# Patient Record
Sex: Male | Born: 1935 | Race: White | Hispanic: No | Marital: Married | Smoking: Former smoker
Health system: Southern US, Community
[De-identification: ages and names within clinical notes are randomized; demographics above are authoritative.]

## PROBLEM LIST (undated history)

## (undated) DIAGNOSIS — K219 Gastro-esophageal reflux disease without esophagitis: Secondary | ICD-10-CM

## (undated) DIAGNOSIS — G20A1 Parkinson's disease without dyskinesia, without mention of fluctuations: Secondary | ICD-10-CM

## (undated) DIAGNOSIS — I10 Essential (primary) hypertension: Secondary | ICD-10-CM

## (undated) DIAGNOSIS — W108XXA Fall (on) (from) other stairs and steps, initial encounter: Secondary | ICD-10-CM

## (undated) DIAGNOSIS — G20 Parkinson's disease: Secondary | ICD-10-CM

## (undated) DIAGNOSIS — N400 Enlarged prostate without lower urinary tract symptoms: Secondary | ICD-10-CM

## (undated) HISTORY — PX: HERNIA REPAIR: SHX51

---

## 1998-07-21 ENCOUNTER — Ambulatory Visit (HOSPITAL_COMMUNITY): Admission: RE | Admit: 1998-07-21 | Discharge: 1998-07-21 | Payer: Self-pay | Admitting: Ophthalmology

## 2000-12-05 ENCOUNTER — Ambulatory Visit (HOSPITAL_COMMUNITY): Admission: RE | Admit: 2000-12-05 | Discharge: 2000-12-05 | Payer: Self-pay | Admitting: Gastroenterology

## 2008-05-15 HISTORY — PX: CATARACT EXTRACTION: SUR2

## 2009-01-13 ENCOUNTER — Emergency Department (HOSPITAL_COMMUNITY): Admission: EM | Admit: 2009-01-13 | Discharge: 2009-01-13 | Payer: Self-pay | Admitting: Emergency Medicine

## 2009-06-23 ENCOUNTER — Ambulatory Visit (HOSPITAL_COMMUNITY)
Admission: RE | Admit: 2009-06-23 | Discharge: 2009-06-23 | Payer: Self-pay | Source: Home / Self Care | Admitting: General Surgery

## 2010-06-06 ENCOUNTER — Encounter
Admission: RE | Admit: 2010-06-06 | Discharge: 2010-06-06 | Payer: Self-pay | Source: Home / Self Care | Attending: Family Medicine | Admitting: Family Medicine

## 2010-08-03 LAB — PROTIME-INR: Prothrombin Time: 13.5 seconds (ref 11.6–15.2)

## 2010-08-03 LAB — BASIC METABOLIC PANEL
BUN: 17 mg/dL (ref 6–23)
CO2: 30 mEq/L (ref 19–32)
Calcium: 8.9 mg/dL (ref 8.4–10.5)
Creatinine, Ser: 0.94 mg/dL (ref 0.4–1.5)
GFR calc Af Amer: >60 mL/min (ref >60)
GFR calc non Af Amer: >60 mL/min (ref >60)
Glucose, Bld: 90 mg/dL (ref 70–99)
Potassium: 4.3 mEq/L (ref 3.5–5.1)

## 2010-08-03 LAB — DIFFERENTIAL
Basophils Relative: 1 % (ref 0–1)
Lymphocytes Relative: 27 % (ref 12–46)
Lymphs Abs: 1.4 10*3/uL (ref 0.7–4.0)
Monocytes Absolute: 0.6 10*3/uL (ref 0.1–1.0)
Monocytes Relative: 11 % (ref 3–12)
Neutro Abs: 3 10*3/uL (ref 1.7–7.7)
Neutrophils Relative %: 59 % (ref 43–77)

## 2010-08-03 LAB — CBC
HCT: 40.1 % (ref 39.0–52.0)
Hemoglobin: 13.9 g/dL (ref 13.0–17.0)
MCHC: 34.5 g/dL (ref 30.0–36.0)
MCV: 95.5 fL (ref 78.0–100.0)
WBC: 5.2 10*3/uL (ref 4.0–10.5)

## 2010-08-03 LAB — APTT: aPTT: 27 seconds (ref 24–37)

## 2010-09-30 NOTE — Procedures (Signed)
[facility]  Patient:    Tim Black, Tim Black                      MRN: [id number] Proc. Date: 12/05/00 Attending:  Verlin Grills, M.D. CC:         Al Decant. Janey Greaser, M.D., [facility], [facility], [address]                           Procedure Report  PROCEDURE:  Surveillance colonoscopy.  PROCEDURE INDICATION:  Mr. [name] (date of birth [birth year]) is a 77 year old male, who is due for surveillance colonoscopy with polypectomy to prevent colon cancer.  Mr. Petta has sigmoid colonic diverticulosis diagnosed by flexible proctosigmoidoscopy in the past.  I discussed with Mr. Hinojosa the complications associated with colonoscopy and polypectomy including a 15 per 1000 risk of bleeding and 4 per 1000 risk of colon rupture requiring emergency surgery.  Mr. Secrist has signed the operative permit.  ENDOSCOPIST:  Verlin Grills, M.D.  PREMEDICATION:  Versed 5 mg, Demerol 50 mg.  ENDOSCOPE:  Olympus pediatric colonoscope.  DESCRIPTION OF PROCEDURE:  After obtaining informed consent, Mr. Ramson was placed in the left lateral decubitus position.  I administered intravenous Demerol and intravenous Versed to achieve conscious sedation for the procedure.  The patients blood pressure, oxygen saturations, and cardiac rhythm were monitored throughout the procedure and documented in the medical record.  Anal inspection was normal.  Digital rectal examination was normal.  The Olympus pediatric video colonoscope was introduced into the rectum and easily advanced to the cecum.  Colonic preparation for the exam today was excellent.  RECTUM:  Normal.  SIGMOID COLON AND DESCENDING COLON:  Extensive left colonic diverticulosis.  SPLENIC FLEXURE:  Normal.  TRANSVERSE COLON:  Normal.  HEPATIC FLEXURE:  Normal.  ASCENDING COLON:  Normal.  CECUM AND ILEOCECAL VALVE:  Normal.  ASSESSMENT:  Left colonic  diverticulosis; otherwise normal proctocolonoscopy to the cecum.  No endoscopic evidence for the presence of colorectal neoplasia. DD:  12/05/00 TD:  12/05/00 Job: [id number]

## 2010-12-19 ENCOUNTER — Ambulatory Visit: Payer: Medicare Other | Attending: Family Medicine | Admitting: Physical Therapy

## 2010-12-19 DIAGNOSIS — R262 Difficulty in walking, not elsewhere classified: Secondary | ICD-10-CM | POA: Insufficient documentation

## 2010-12-19 DIAGNOSIS — IMO0001 Reserved for inherently not codable concepts without codable children: Secondary | ICD-10-CM | POA: Insufficient documentation

## 2010-12-19 DIAGNOSIS — R269 Unspecified abnormalities of gait and mobility: Secondary | ICD-10-CM | POA: Insufficient documentation

## 2010-12-19 DIAGNOSIS — M6281 Muscle weakness (generalized): Secondary | ICD-10-CM | POA: Insufficient documentation

## 2010-12-21 ENCOUNTER — Ambulatory Visit: Payer: Medicare Other | Admitting: Physical Therapy

## 2011-01-04 ENCOUNTER — Encounter: Payer: Medicare Other | Admitting: Physical Therapy

## 2011-01-09 ENCOUNTER — Encounter: Payer: Medicare Other | Admitting: Physical Therapy

## 2011-01-12 ENCOUNTER — Encounter: Payer: Medicare Other | Admitting: Physical Therapy

## 2011-04-06 ENCOUNTER — Emergency Department (HOSPITAL_COMMUNITY): Payer: Medicare Other

## 2011-04-06 ENCOUNTER — Encounter: Payer: Self-pay | Admitting: *Deleted

## 2011-04-06 ENCOUNTER — Emergency Department (HOSPITAL_COMMUNITY)
Admission: EM | Admit: 2011-04-06 | Discharge: 2011-04-06 | Disposition: A | Discharge status: Home / Self Care | Payer: Medicare Other | Attending: Emergency Medicine | Admitting: Emergency Medicine

## 2011-04-06 DIAGNOSIS — S0100XA Unspecified open wound of scalp, initial encounter: Secondary | ICD-10-CM | POA: Insufficient documentation

## 2011-04-06 DIAGNOSIS — S0101XA Laceration without foreign body of scalp, initial encounter: Secondary | ICD-10-CM

## 2011-04-06 DIAGNOSIS — I10 Essential (primary) hypertension: Secondary | ICD-10-CM | POA: Insufficient documentation

## 2011-04-06 DIAGNOSIS — K219 Gastro-esophageal reflux disease without esophagitis: Secondary | ICD-10-CM | POA: Insufficient documentation

## 2011-04-06 DIAGNOSIS — Y998 Other external cause status: Secondary | ICD-10-CM | POA: Insufficient documentation

## 2011-04-06 DIAGNOSIS — W108XXA Fall (on) (from) other stairs and steps, initial encounter: Secondary | ICD-10-CM | POA: Insufficient documentation

## 2011-04-06 HISTORY — DX: Gastro-esophageal reflux disease without esophagitis: K21.9

## 2011-04-06 HISTORY — DX: Essential (primary) hypertension: I10

## 2011-04-06 LAB — CBC
MCV: 93.5 fL (ref 78.0–100.0)
Platelets: 205 10*3/uL (ref 150–400)

## 2011-04-06 LAB — POCT I-STAT, CHEM 8
Glucose, Bld: 110 mg/dL — ABNORMAL HIGH (ref 70–99)
HCT: 42 % (ref 39.0–52.0)

## 2011-04-06 NOTE — ED Notes (Signed)
Patient not in room.will obtain labs upon patient return 

## 2011-04-06 NOTE — ED Notes (Signed)
Patient transported to X-ray 

## 2011-04-06 NOTE — ED Notes (Signed)
Family at bedside. 

## 2011-04-06 NOTE — ED Notes (Signed)
Pt placed in C-collar in triage

## 2011-04-06 NOTE — ED Provider Notes (Signed)
History     CSN: [id number] Arrival date & time: 04/06/2011  3:41 PM   First MD Initiated Contact with Patient 04/06/11 1601      Chief Complaint  Patient presents with  . Fall  . Head Injury    (Consider location/radiation/quality/duration/timing/severity/associated sxs/prior treatment) Patient is a 77 y.o. male presenting with fall and head injury. The history is provided by the patient and a relative. No language interpreter was used.  Fall The accident occurred 1 to 2 hours ago. The fall occurred while walking. Distance fallen: 3-5 stairs. He landed on concrete. The volume of blood lost was minimal. The point of impact was the head. The pain is at a severity of 8/10. The pain is severe. He was not ambulatory at the scene. There was no entrapment after the fall. There was no drug use involved in the accident. There was no alcohol use involved in the accident. Pertinent negatives include no visual change, no fever, no numbness, no abdominal pain, no bowel incontinence, no nausea, no vomiting, no hematuria, no headaches, no hearing loss and no tingling. Exacerbated by: nothing. Treatment on scene includes a c-collar. He has tried nothing for the symptoms. The treatment provided no relief.  Head Injury  Pertinent negatives include no numbness and no vomiting.    Past Medical History  Diagnosis Date  . Hypertension   . GERD (gastroesophageal reflux disease)     Past Surgical History  Procedure Date  . Hernia repair     No family history on file.  History  Substance Use Topics  . Smoking status: Never Smoker   . Smokeless tobacco: Not on file  . Alcohol Use: Yes      Review of Systems  Constitutional: Negative for fever.  HENT: Negative for facial swelling.   Eyes: Negative for discharge.  Respiratory: Negative for chest tightness.   Cardiovascular: Negative for chest pain.  Gastrointestinal: Negative for nausea, vomiting, abdominal pain, abdominal distention and  bowel incontinence.  Genitourinary: Negative for hematuria.  Musculoskeletal: Negative for arthralgias.  Skin: Negative.   Neurological: Negative for tingling, numbness and headaches.  Hematological: Negative.   Psychiatric/Behavioral: Negative.     Allergies  Review of patient's allergies indicates no known allergies.  Home Medications  No current outpatient prescriptions on file.  BP 142/61  Pulse 82  Resp 18  SpO2 98%  Physical Exam  Nursing note and vitals reviewed. Constitutional:       frail  HENT:  Right Ear: No hemotympanum.  Left Ear: No hemotympanum.       Scalp laceration  Eyes: EOM are normal. Pupils are equal, round, and reactive to light.  Neck: No tracheal deviation present.       c collar  Cardiovascular: Normal rate and regular rhythm.   Pulmonary/Chest: Effort normal and breath sounds normal. He exhibits no tenderness.  Abdominal: Soft. Bowel sounds are normal. He exhibits no mass. There is no rebound and no guarding.  Musculoskeletal: Normal range of motion. He exhibits no tenderness.  Neurological: He is alert. He displays normal reflexes.  Skin: Skin is warm and dry.  Psychiatric: Thought content normal.    ED Course  LACERATION REPAIR Date/Time: 04/06/2011 5:48 PM Performed by: Jasmine Awe Authorized by: Jasmine Awe Consent: Verbal consent obtained. Consent given by: patient Patient understanding: patient states understanding of the procedure being performed Patient identity confirmed: verbally with patient and arm band Body area: head/neck Location details: scalp Laceration length: 2 cm Foreign bodies: no  foreign bodies Tendon involvement: none Nerve involvement: none Vascular damage: no Amount of cleaning: extensive Skin closure: staples Number of sutures: 2 Approximation: loose Approximation difficulty: simple Patient tolerance: Patient tolerated the procedure well with no immediate complications.    (including critical care time)  Labs Reviewed - No data to display No results found.   No diagnosis found.    MDM   Follow up in 7 days for staple removal return        Marl Seago K Wynonna Fitzhenry-Rasch, MD 04/06/11 2307

## 2011-04-06 NOTE — ED Notes (Signed)
Patient transported to CT 

## 2011-04-06 NOTE — ED Notes (Signed)
Pt fell down approx 4-5 brick steps PTA. Unknown LOC. Found on rt side with head at base of steps, noted abrasions to rt head and knee, pain in upper arm with abrasions.

## 2011-04-15 ENCOUNTER — Encounter (HOSPITAL_COMMUNITY): Payer: Self-pay | Admitting: Emergency Medicine

## 2011-04-15 ENCOUNTER — Emergency Department (HOSPITAL_COMMUNITY): Payer: Medicare Other

## 2011-04-15 ENCOUNTER — Inpatient Hospital Stay (HOSPITAL_COMMUNITY)
Admission: EM | Admit: 2011-04-15 | Discharge: 2011-04-20 | DRG: 193 | Disposition: A | Discharge status: Home / Self Care | Payer: Medicare Other | Attending: Internal Medicine | Admitting: Internal Medicine

## 2011-04-15 DIAGNOSIS — N183 Chronic kidney disease, stage 3 unspecified: Secondary | ICD-10-CM | POA: Diagnosis present

## 2011-04-15 DIAGNOSIS — E871 Hypo-osmolality and hyponatremia: Secondary | ICD-10-CM | POA: Diagnosis present

## 2011-04-15 DIAGNOSIS — IMO0002 Reserved for concepts with insufficient information to code with codable children: Secondary | ICD-10-CM | POA: Diagnosis present

## 2011-04-15 DIAGNOSIS — I129 Hypertensive chronic kidney disease with stage 1 through stage 4 chronic kidney disease, or unspecified chronic kidney disease: Secondary | ICD-10-CM | POA: Diagnosis present

## 2011-04-15 DIAGNOSIS — D649 Anemia, unspecified: Secondary | ICD-10-CM | POA: Diagnosis present

## 2011-04-15 DIAGNOSIS — E860 Dehydration: Secondary | ICD-10-CM | POA: Diagnosis present

## 2011-04-15 DIAGNOSIS — I480 Paroxysmal atrial fibrillation: Secondary | ICD-10-CM

## 2011-04-15 DIAGNOSIS — I509 Heart failure, unspecified: Secondary | ICD-10-CM | POA: Diagnosis present

## 2011-04-15 DIAGNOSIS — I5033 Acute on chronic diastolic (congestive) heart failure: Secondary | ICD-10-CM

## 2011-04-15 DIAGNOSIS — I4891 Unspecified atrial fibrillation: Secondary | ICD-10-CM | POA: Diagnosis present

## 2011-04-15 DIAGNOSIS — J189 Pneumonia, unspecified organism: Principal | ICD-10-CM | POA: Diagnosis present

## 2011-04-15 HISTORY — DX: Benign prostatic hyperplasia without lower urinary tract symptoms: N40.0

## 2011-04-15 HISTORY — DX: Fall (on) (from) other stairs and steps, initial encounter: W10.8XXA

## 2011-04-15 LAB — CBC
HCT: 35.3 % — ABNORMAL LOW (ref 39.0–52.0)
MCH: 32.4 pg (ref 26.0–34.0)
MCHC: 34.3 g/dL (ref 30.0–36.0)
MCV: 94.6 fL (ref 78.0–100.0)
Platelets: 222 10*3/uL (ref 150–400)
RDW: 12.8 % (ref 11.5–15.5)

## 2011-04-15 LAB — DIFFERENTIAL
Basophils Absolute: 0 10*3/uL (ref 0.0–0.1)
Basophils Relative: 0 % (ref 0–1)
Eosinophils Absolute: 0 10*3/uL (ref 0.0–0.7)
Eosinophils Relative: 0 % (ref 0–5)
Monocytes Absolute: 1 10*3/uL (ref 0.1–1.0)

## 2011-04-15 LAB — BASIC METABOLIC PANEL
CO2: 23 mEq/L (ref 19–32)
Calcium: 9 mg/dL (ref 8.4–10.5)
Creatinine, Ser: 1.25 mg/dL (ref 0.50–1.35)
GFR calc Af Amer: 59 mL/min — ABNORMAL LOW (ref >90)
GFR calc non Af Amer: 51 mL/min — ABNORMAL LOW (ref >90)

## 2011-04-15 LAB — PRO B NATRIURETIC PEPTIDE: Pro B Natriuretic peptide (BNP): 1594 pg/mL — ABNORMAL HIGH (ref 0–450)

## 2011-04-15 LAB — PROTIME-INR: Prothrombin Time: 13.8 seconds (ref 11.6–15.2)

## 2011-04-15 MED ORDER — DEXTROSE 5 % IV SOLN
1.0000 g | INTRAVENOUS | Status: DC
  Administered 2011-04-15: 1 g via INTRAVENOUS
  Filled 2011-04-15: qty 10

## 2011-04-15 MED ORDER — AZITHROMYCIN 250 MG PO TABS
500.0000 mg | ORAL_TABLET | Freq: Once | ORAL | Status: AC
Start: 2011-04-15 — End: 2011-04-15
  Administered 2011-04-15: 500 mg via ORAL
  Filled 2011-04-15: qty 2

## 2011-04-15 NOTE — ED Provider Notes (Signed)
History     CSN: [id number] Arrival date & time: 04/15/2011  6:25 PM   First MD Initiated Contact with Patient 04/15/11 1850      Chief Complaint  Patient presents with  . Atrial Fibrillation    went to [facility] today for cough and was found with new onset A fib  . Cough    6 days  . Shortness of Breath    (Consider location/radiation/quality/duration/timing/severity/associated sxs/prior treatment) Patient is a 77 y.o. male presenting with atrial fibrillation, cough, and shortness of breath. The history is provided by the patient.  Atrial Fibrillation The current episode started more than 2 days ago. The problem occurs constantly. The problem has not changed since onset.Pertinent negatives include no chest pain and no shortness of breath. The symptoms are aggravated by nothing. The symptoms are relieved by nothing. He has tried nothing for the symptoms.  Cough This is a new problem. The problem occurs hourly. The cough is non-productive. There has been no fever. Pertinent negatives include no chest pain and no shortness of breath.  Shortness of Breath  Associated symptoms include cough. Pertinent negatives include no chest pain and no shortness of breath.   Pt went to the walk in clinic at [facility] today to be checked for a cough. The walk in clinic doctor diagnosed him with atrial fibrillation and sent him to the ED for further evaluation. Past Medical History  Diagnosis Date  . Hypertension   . GERD (gastroesophageal reflux disease)   . Prostate cancer     Past Surgical History  Procedure Date  . Hernia repair     No family history on file.  History  Substance Use Topics  . Smoking status: Never Smoker   . Smokeless tobacco: Not on file  . Alcohol Use: Yes      Review of Systems  Respiratory: Positive for cough. Negative for shortness of breath.   Cardiovascular: Negative for chest pain.  All other systems reviewed and are negative.    Allergies  Review of  patient's allergies indicates no known allergies.  Home Medications   Current Outpatient Rx  Name Route Sig Dispense Refill  . ASPIRIN 81 MG PO TBEC Oral Take 81 mg by mouth 2 days. Swallow whole.     Marland Kitchen FINASTERIDE 5 MG PO TABS Oral Take 5 mg by mouth daily.      Marland Kitchen ONE-DAILY MULTI VITAMINS PO TABS Oral Take 1 tablet by mouth daily.      Marland Kitchen OMEPRAZOLE 20 MG PO CPDR Oral Take 20 mg by mouth daily.        BP 136/59  Pulse 79  Temp(Src) 98.8 F (37.1 C) (Oral)  Resp 18  SpO2 99%  Physical Exam  Nursing note and vitals reviewed. Constitutional: He appears well-developed and well-nourished. No distress.  HENT:  Head: Normocephalic and atraumatic.  Right Ear: External ear normal.  Left Ear: External ear normal.  Eyes: Conjunctivae are normal. Right eye exhibits no discharge. Left eye exhibits no discharge. No scleral icterus.  Neck: Neck supple. No tracheal deviation present.  Cardiovascular: Normal rate and intact distal pulses.  An irregular rhythm present.  Pulmonary/Chest: Effort normal and breath sounds normal. No stridor. No respiratory distress. He has no wheezes. He has no rales.  Abdominal: Soft. Bowel sounds are normal. He exhibits no distension. There is no tenderness. There is no rebound and no guarding.  Musculoskeletal: He exhibits no edema and no tenderness.  Neurological: He is alert. He has normal  strength. No sensory deficit. Cranial nerve deficit:  no gross defecits noted. He exhibits normal muscle tone. He displays no seizure activity. Coordination normal.  Skin: Skin is warm and dry. No rash noted.  Psychiatric: He has a normal mood and affect.    ED Course  Procedures (including critical care time)  Date: 04/15/2011  Rate: 89  Rhythm: normal sinus rhythm and premature ventricular contractions (PVC)  QRS Axis: normal  Intervals: normal  ST/T Wave abnormalities: normal  Conduction Disutrbances:none  Narrative Interpretation: A fib not noted on current EKG   Old EKG Reviewed: No significant change   Medications  lisinopril-hydrochlorothiazide (PRINZIDE,ZESTORETIC) 10-12.5 MG per tablet (not administered)  cefTRIAXone (ROCEPHIN) 1 g in dextrose 5 % 50 mL IVPB (not administered)  azithromycin (ZITHROMAX) tablet 500 mg (not administered)     Labs Reviewed  CBC - Abnormal; Notable for the following:    WBC 10.7 (*)    RBC 3.73 (*)    Hemoglobin 12.1 (*)    HCT 35.3 (*)    All other components within normal limits  DIFFERENTIAL - Abnormal; Notable for the following:    Neutrophils Relative 83 (*)    Neutro Abs 8.9 (*)    Lymphocytes Relative 7 (*)    All other components within normal limits  BASIC METABOLIC PANEL - Abnormal; Notable for the following:    Sodium 131 (*)    Glucose, Bld 102 (*)    BUN 37 (*)    GFR calc non Af Amer 51 (*)    GFR calc Af Amer 59 (*)    All other components within normal limits  PRO B NATRIURETIC PEPTIDE - Abnormal; Notable for the following:    BNP, POC 1594.0 (*)    All other components within normal limits  PROTIME-INR  TROPONIN I  APTT   Dg Chest 2 View  04/15/2011  *RADIOLOGY REPORT*  Clinical Data: Cough with shortness of breath  CHEST - 2 VIEW  Comparison: 04/06/2011.  Findings: Cardiomegaly.  Marked worsening aeration compared with priors with new areas of airspace opacity in the left mid and lower lung zones.  Findings consistent with development of acute bacterial pneumonia likely involving the left lower lobe.  Moderate hyperinflation suggesting COPD.  Calcified tortuous aorta. Osteopenia.  IMPRESSION:  Left mid and lower lung zone infiltrates as described. Cardiomegaly.  These are new from 04/06/2011.  The the  Original Report Authenticated By: Elsie Stain, M.D.     1. Community acquired pneumonia   2. Paroxysmal atrial fibrillation       MDM  Patient presents to the emergency room from an outside clinic with complaints of cough and atrial fibrillation. The EKGs here in the  emergency room show a normal sinus rhythm with sinus arrhythmia. I did review the EKGs at the other facility and the patient was clearly  more tachycardic and I do agree that it looked like atrial fibrillation.  The patient however does appear to have a pneumonia on the chest x-ray. He has not been in the hospital recently and I will treat him for a community acquired pneumonia. I will consult the hospitalist for admission considering the patient's age and comorbidities.        Celene Kras, MD 04/15/11 2157

## 2011-04-15 NOTE — H&P (Signed)
PCP:   [name], MD   Chief Complaint:  Cough 4-6 days  HPI: Tim Black is a 77 y.o. male   has a past medical history of Hypertension; GERD (gastroesophageal reflux disease); and Prostate cancer.   Presented with  Initially to [facility] urgent care with cough. Had CXR done that showed pneumonia. ECG showed A. Fib that was new. Since arrival to ED this seem to be Paroxysmal. With patient going in and out of this.  OF not last week he has fallen and hit his head resulting in scalp lacerations.   No recent headachaches or unstable gait no changes in mental status.  Review of Systems:    Pertinent Positives: fatigue, cough, no appetitie  Pertinent Negatives:no CP, no SOB, no nausea/vomiting Otherwise ROS are negative except for above, 10 systems were reviewed  Past Medical History: Past Medical History  Diagnosis Date  . Hypertension   . GERD (gastroesophageal reflux disease)   . Prostate cancer    Past Surgical History  Procedure Date  . Hernia repair      Medications: Prior to Admission medications   Medication Sig Start Date End Date Taking? Authorizing Provider  finasteride (PROSCAR) 5 MG tablet Take 5 mg by mouth daily.     Yes Historical Provider, MD  lisinopril-hydrochlorothiazide (PRINZIDE,ZESTORETIC) 10-12.5 MG per tablet Take 1 tablet by mouth daily.     Yes Historical Provider, MD  Multiple Vitamin (MULTIVITAMIN) tablet Take 1 tablet by mouth daily.     Yes Historical Provider, MD  omeprazole (PRILOSEC) 20 MG capsule Take 20 mg by mouth daily.     Yes Historical Provider, MD  aspirin 81 MG EC tablet Take 81 mg by mouth every other day. Swallow whole.    Historical Provider, MD    Allergies:  No Known Allergies  Social History:  reports that he has never smoked. He does not have any smokeless tobacco history on file. He reports that he drinks alcohol. He reports that he does not use illicit drugs.   Family History: family history is not on file.     Physical Exam: Patient Vitals for the past 24 hrs:  BP Temp Temp src Pulse Resp SpO2  04/15/11 2330 125/54 mmHg - - - 27  -  04/15/11 2300 114/56 mmHg - - 91  31  94 %  04/15/11 2257 116/58 mmHg 98.8 F (37.1 C) Oral 92  16  94 %  04/15/11 2230 92/45 mmHg - - - 23  -  04/15/11 2200 116/69 mmHg - - - 19  -  04/15/11 2145 131/62 mmHg - - 87  28  95 %  04/15/11 2130 110/60 mmHg - - 86  29  94 %  04/15/11 2115 119/65 mmHg - - 94  23  94 %  04/15/11 2000 111/58 mmHg - - 109  30  92 %  04/15/11 1945 118/68 mmHg - - 99  22  96 %  04/15/11 1930 124/66 mmHg - - 105  30  95 %  04/15/11 1915 120/65 mmHg - - 111  28  97 %  04/15/11 1900 110/61 mmHg - - 92  25  97 %  04/15/11 1829 136/59 mmHg 98.8 F (37.1 C) Oral 79  18  99 %  04/15/11 1811 - - - - - 97 %     Alert and Oriented in No Acute distress Mucous Membranes and Skin: Dry mucous membranes decreased skin to Head well healing stitches on her right side of the  head Heart: Somewhat irregular no murmurs appreciated Lungs: Crackles throughout no wheezes Abdomen: Soft nontender nondistended Lower extremities: No clubbing cyanosis or edema Neurologically Grossly intact: Strength intact Skin clean Dry  no rash: Abrasions to the knees noted body mass index is unknown because there is no height or weight on file.   Labs on Admission:   [facility] 04/15/11 1943  NA 131*  K 3.9  CL 98  CO2 23  GLUCOSE 102*  BUN 37*  CREATININE 1.25  CALCIUM 9.0  MG --  PHOS --   No results found for this basename: AST:2,ALT:2,ALKPHOS:2,BILITOT:2,PROT:2,ALBUMIN:2 in the last 72 hours No results found for this basename: LIPASE:2,AMYLASE:2 in the last 72 hours  Basename 04/15/11 1943  WBC 10.7*  NEUTROABS 8.9*  HGB 12.1*  HCT 35.3*  MCV 94.6  PLT 222    Basename 04/15/11 1943  CKTOTAL --  CKMB --  CKMBINDEX --  TROPONINI <0.30   No results found for this basename: TSH,T4TOTAL,FREET3,T3FREE,THYROIDAB in the last 72 hours No results  found for this basename: VITAMINB12:2,FOLATE:2,FERRITIN:2,TIBC:2,IRON:2,RETICCTPCT:2 in the last 72 hours No results found for this basename: HGBA1C    CrCl is unknown because there is no height on file for the current visit. ABG    Component Value Date/Time   TCO2 29 04/06/2011 1734     No results found for this basename: DDIMER     Other results: ED ECG REPORT  Rate: 89  Rhythm: Sinus arrhythmia with PVCs ST&T Change: No ischemic changes noted    Blood Culture No results found for this basename: sdes, specrequest, cult, reptstatus       Radiological Exams on Admission: Dg Chest 2 View  04/15/2011  *RADIOLOGY REPORT*  Clinical Data: Cough with shortness of breath  CHEST - 2 VIEW  Comparison: 04/06/2011.  Findings: Cardiomegaly.  Marked worsening aeration compared with priors with new areas of airspace opacity in the left mid and lower lung zones.  Findings consistent with development of acute bacterial pneumonia likely involving the left lower lobe.  Moderate hyperinflation suggesting COPD.  Calcified tortuous aorta. Osteopenia.  IMPRESSION:  Left mid and lower lung zone infiltrates as described. Cardiomegaly.  These are new from 04/06/2011.  The the  Original Report Authenticated By: Elsie Stain, M.D.    Assessment/Plan  Present on Admission:  .Pneumonia - this is community-acquired we'll cover with Rocephin and azithromycin, will admit for treatment of CAP will start on appropriate antibiotic coverage.   Obtain sputum cultures, blood cultures if febrile or if decompensates.  Provide oxygen as needed.   .Atrial fib/flutter, transient - this is new in onset appears to be transient patient has a runs of A. fib as watching him on a monitor then goes back to sinus rhythm. Given the fact that he has had a recent head trauma will obtain a repeat CT scan of his head prior to initiating anticoagulation. Whether he needs to be a long-term Coumadin should be considered given his recent  falls and advanced age. We'll cycle cardiac markers check TSH and obtain echocardiogram in the morning. Duration of atrial fibrillation at this point is unclear  .Hyponatremia - this is likely secondary to dehydration will check urine lites follow sodium give IV fluids  .Anemia - this is mild can be worked up as an outpatient  .Dehydration - patient appears to be clinically to be on the dry side we'll give IV fluids check orthostatics   Prophylaxis: Protonix and Lovenox CT scan of the head is unremarkable  CODE STATUS: Full code   Milania Haubner 04/15/2011, 11:59 PM

## 2011-04-16 ENCOUNTER — Emergency Department (HOSPITAL_COMMUNITY): Payer: Medicare Other

## 2011-04-16 ENCOUNTER — Encounter (HOSPITAL_COMMUNITY): Payer: Self-pay | Admitting: Internal Medicine

## 2011-04-16 DIAGNOSIS — D649 Anemia, unspecified: Secondary | ICD-10-CM | POA: Diagnosis present

## 2011-04-16 DIAGNOSIS — E871 Hypo-osmolality and hyponatremia: Secondary | ICD-10-CM | POA: Diagnosis present

## 2011-04-16 DIAGNOSIS — E860 Dehydration: Secondary | ICD-10-CM | POA: Diagnosis present

## 2011-04-16 LAB — URINALYSIS, ROUTINE W REFLEX MICROSCOPIC
Leukocytes, UA: NEGATIVE
Protein, ur: NEGATIVE mg/dL
Urobilinogen, UA: 1 mg/dL (ref 0.0–1.0)

## 2011-04-16 LAB — CARDIAC PANEL(CRET KIN+CKTOT+MB+TROPI)
Relative Index: 2.2 (ref 0.0–2.5)
Relative Index: 2.3 (ref 0.0–2.5)
Total CK: 142 U/L (ref 7–232)
Total CK: 140 U/L (ref 7–232)
Total CK: 128 U/L (ref 7–232)
Troponin I: <0.3 ng/mL (ref <0.30)

## 2011-04-16 LAB — DIFFERENTIAL
Basophils Absolute: 0 10*3/uL (ref 0.0–0.1)
Basophils Relative: 0 % (ref 0–1)
Eosinophils Absolute: 0 10*3/uL (ref 0.0–0.7)
Eosinophils Relative: 0 % (ref 0–5)
Monocytes Absolute: 1.1 10*3/uL — ABNORMAL HIGH (ref 0.1–1.0)

## 2011-04-16 LAB — CBC
MCH: 32.2 pg (ref 26.0–34.0)
MCHC: 33.9 g/dL (ref 30.0–36.0)
MCV: 95.1 fL (ref 78.0–100.0)
Platelets: 225 10*3/uL (ref 150–400)
RDW: 13 % (ref 11.5–15.5)
WBC: 9.3 10*3/uL (ref 4.0–10.5)

## 2011-04-16 LAB — COMPREHENSIVE METABOLIC PANEL
ALT: 30 U/L (ref 0–53)
Albumin: 2.6 g/dL — ABNORMAL LOW (ref 3.5–5.2)
Alkaline Phosphatase: 97 U/L (ref 39–117)
BUN: 32 mg/dL — ABNORMAL HIGH (ref 6–23)
Chloride: 100 mEq/L (ref 96–112)
Potassium: 3.7 mEq/L (ref 3.5–5.1)
Sodium: 137 mEq/L (ref 135–145)
Total Bilirubin: 0.4 mg/dL (ref 0.3–1.2)

## 2011-04-16 LAB — PROTIME-INR: Prothrombin Time: 14.2 seconds (ref 11.6–15.2)

## 2011-04-16 LAB — HEMOGLOBIN A1C
Hgb A1c MFr Bld: 6 % — ABNORMAL HIGH (ref <5.7)
Mean Plasma Glucose: 126 mg/dL — ABNORMAL HIGH (ref <117)

## 2011-04-16 LAB — LIPID PANEL
Cholesterol: 134 mg/dL (ref 0–200)
Triglycerides: 89 mg/dL (ref <150)

## 2011-04-16 LAB — PHOSPHORUS: Phosphorus: 3.1 mg/dL (ref 2.3–4.6)

## 2011-04-16 LAB — SODIUM, URINE, RANDOM: Sodium, Ur: 114 mEq/L

## 2011-04-16 LAB — STREP PNEUMONIAE URINARY ANTIGEN: Strep Pneumo Urinary Antigen: NEGATIVE

## 2011-04-16 MED ORDER — IPRATROPIUM BROMIDE 0.02 % IN SOLN
0.5000 mg | Freq: Four times a day (QID) | RESPIRATORY_TRACT | Status: DC
  Administered 2011-04-16 – 2011-04-17 (×7): 0.5 mg via RESPIRATORY_TRACT
  Filled 2011-04-16 (×7): qty 2.5

## 2011-04-16 MED ORDER — FINASTERIDE 5 MG PO TABS
5.0000 mg | ORAL_TABLET | Freq: Every day | ORAL | Status: DC
  Administered 2011-04-16 – 2011-04-20 (×5): 5 mg via ORAL
  Filled 2011-04-16 (×5): qty 1

## 2011-04-16 MED ORDER — PANTOPRAZOLE SODIUM 40 MG PO TBEC
40.0000 mg | DELAYED_RELEASE_TABLET | Freq: Every day | ORAL | Status: DC
  Administered 2011-04-16 – 2011-04-19 (×4): 40 mg via ORAL
  Filled 2011-04-16 (×6): qty 1

## 2011-04-16 MED ORDER — ALBUTEROL SULFATE (5 MG/ML) 0.5% IN NEBU
2.5000 mg | INHALATION_SOLUTION | Freq: Four times a day (QID) | RESPIRATORY_TRACT | Status: DC
  Administered 2011-04-16 – 2011-04-17 (×7): 2.5 mg via RESPIRATORY_TRACT
  Filled 2011-04-16: qty 0.5
  Filled 2011-04-16: qty 1
  Filled 2011-04-16 (×5): qty 0.5

## 2011-04-16 MED ORDER — DEXTROSE 5 % IV SOLN
500.0000 mg | INTRAVENOUS | Status: DC
  Administered 2011-04-16 – 2011-04-18 (×3): 500 mg via INTRAVENOUS
  Filled 2011-04-16 (×4): qty 500

## 2011-04-16 MED ORDER — ENOXAPARIN SODIUM 40 MG/0.4ML ~~LOC~~ SOLN
40.0000 mg | SUBCUTANEOUS | Status: DC
  Administered 2011-04-16 – 2011-04-20 (×5): 40 mg via SUBCUTANEOUS
  Filled 2011-04-16 (×5): qty 0.4

## 2011-04-16 MED ORDER — SODIUM CHLORIDE 0.9 % IV SOLN
INTRAVENOUS | Status: AC
  Administered 2011-04-16: 06:00:00 via INTRAVENOUS

## 2011-04-16 MED ORDER — METOPROLOL TARTRATE 12.5 MG HALF TABLET
12.5000 mg | ORAL_TABLET | Freq: Four times a day (QID) | ORAL | Status: DC
Start: 2011-04-16 — End: 2011-04-20
  Administered 2011-04-16 – 2011-04-20 (×17): 12.5 mg via ORAL
  Filled 2011-04-16 (×5): qty 1
  Filled 2011-04-16: qty 0.5
  Filled 2011-04-16 (×14): qty 1
  Filled 2011-04-16: qty 0.5
  Filled 2011-04-16 (×2): qty 1

## 2011-04-16 MED ORDER — DEXTROSE 5 % IV SOLN
1.0000 g | INTRAVENOUS | Status: DC
  Administered 2011-04-16 – 2011-04-18 (×3): 1 g via INTRAVENOUS
  Filled 2011-04-16 (×4): qty 10

## 2011-04-16 NOTE — Progress Notes (Signed)
Patient ID: Tim Black, male   DOB: 11-23-1923, 77 y.o.   MRN: [id number] Subjective: Patient seen.Complain of cough-not productive.He denies any chest pain or sob.No fever   Objective: Weight change:   Intake/Output Summary (Last 24 hours) at 04/16/11 0829 Last data filed at 04/16/11 0616  Gross per 24 hour  Intake      0 ml  Output    725 ml  Net   -725 ml   BP 118/61  Pulse 66  Temp(Src) 98.3 F (36.8 C) (Oral)  Resp 20  SpO2 97% Physical Exam: General appearance: alert, cooperative and no distress,dehydrated Head: Normocephalic, without obvious abnormality, atraumatic Neck: no adenopathy, no carotid bruit, no JVD, supple, symmetrical, trachea midline and thyroid not enlarged, symmetric, no tenderness/mass/nodules Lungs: Crackles lower zones of the lungs bilaterally(left>Right) Heart: regular rate and rhythm, S1, S2 normal, no murmur, click, rub or gallop Abdomen: soft, non-tender; bowel sounds normal; no masses,  no organomegaly Extremities: extremities normal, atraumatic, no cyanosis or edema Skin: Dry  Lab Results: Results for orders placed during the hospital encounter of 04/15/11 (from the past 48 hour(s))  PRO B NATRIURETIC PEPTIDE     Status: Abnormal   Collection Time   04/15/11  7:29 PM      Component Value Range Comment   BNP, POC 1594.0 (*) 0 - 450 (pg/mL)   CBC     Status: Abnormal   Collection Time   04/15/11  7:43 PM      Component Value Range Comment   WBC 10.7 (*) 4.0 - 10.5 (K/uL)    RBC 3.73 (*) 4.22 - 5.81 (MIL/uL)    Hemoglobin 12.1 (*) 13.0 - 17.0 (g/dL)    HCT 86.5 (*) 78.4 - 52.0 (%)    MCV 94.6  78.0 - 100.0 (fL)    MCH 32.4  26.0 - 34.0 (pg)    MCHC 34.3  30.0 - 36.0 (g/dL)    RDW 69.6  29.5 - 28.4 (%)    Platelets 222  150 - 400 (K/uL)   DIFFERENTIAL     Status: Abnormal   Collection Time   04/15/11  7:43 PM      Component Value Range Comment   Neutrophils Relative 83 (*) 43 - 77 (%)    Neutro Abs 8.9 (*) 1.7 - 7.7 (K/uL)    Lymphocytes Relative 7 (*) 12 - 46 (%)    Lymphs Abs 0.8  0.7 - 4.0 (K/uL)    Monocytes Relative 9  3 - 12 (%)    Monocytes Absolute 1.0  0.1 - 1.0 (K/uL)    Eosinophils Relative 0  0 - 5 (%)    Eosinophils Absolute 0.0  0.0 - 0.7 (K/uL)    Basophils Relative 0  0 - 1 (%)    Basophils Absolute 0.0  0.0 - 0.1 (K/uL)   BASIC METABOLIC PANEL     Status: Abnormal   Collection Time   04/15/11  7:43 PM      Component Value Range Comment   Sodium 131 (*) 135 - 145 (mEq/L)    Potassium 3.9  3.5 - 5.1 (mEq/L)    Chloride 98  96 - 112 (mEq/L)    CO2 23  19 - 32 (mEq/L)    Glucose, Bld 102 (*) 70 - 99 (mg/dL)    BUN 37 (*) 6 - 23 (mg/dL)    Creatinine, Ser 1.32  0.50 - 1.35 (mg/dL)    Calcium 9.0  8.4 - 10.5 (mg/dL)    GFR  calc non Af Amer 51 (*) >90 (mL/min)    GFR calc Af Amer 59 (*) >90 (mL/min)   PROTIME-INR     Status: Normal   Collection Time   04/15/11  7:43 PM      Component Value Range Comment   Prothrombin Time 13.8  11.6 - 15.2 (seconds)    INR 1.04  0.00 - 1.49    TROPONIN I     Status: Normal   Collection Time   04/15/11  7:43 PM      Component Value Range Comment   Troponin I <0.30  <0.30 (ng/mL)   APTT     Status: Normal   Collection Time   04/15/11  7:43 PM      Component Value Range Comment   aPTT 37  24 - 37 (seconds)   URINALYSIS, ROUTINE W REFLEX MICROSCOPIC     Status: Abnormal   Collection Time   04/16/11  2:19 AM      Component Value Range Comment   Color, Urine YELLOW  YELLOW     APPearance CLEAR  CLEAR     Specific Gravity, Urine 1.019  1.005 - 1.030     pH 5.5  5.0 - 8.0     Glucose, UA NEGATIVE  NEGATIVE (mg/dL)    Hgb urine dipstick LARGE (*) NEGATIVE     Bilirubin Urine NEGATIVE  NEGATIVE     Ketones, ur NEGATIVE  NEGATIVE (mg/dL)    Protein, ur NEGATIVE  NEGATIVE (mg/dL)    Urobilinogen, UA 1.0  0.0 - 1.0 (mg/dL)    Nitrite NEGATIVE  NEGATIVE     Leukocytes, UA NEGATIVE  NEGATIVE    URINE MICROSCOPIC-ADD ON     Status: Normal   Collection Time    04/16/11  2:19 AM      Component Value Range Comment   WBC, UA 0-2  <3 (WBC/hpf)    RBC / HPF 7-10  <3 (RBC/hpf)   CARDIAC PANEL(CRET KIN+CKTOT+MB+TROPI)     Status: Normal   Collection Time   04/16/11  2:47 AM      Component Value Range Comment   Total CK 142  7 - 232 (U/L)    CK, MB 3.4  0.3 - 4.0 (ng/mL)    Troponin I <0.30  <0.30 (ng/mL)    Relative Index 2.4  0.0 - 2.5    MAGNESIUM     Status: Normal   Collection Time   04/16/11  4:11 AM      Component Value Range Comment   Magnesium 2.1  1.5 - 2.5 (mg/dL)   PHOSPHORUS     Status: Normal   Collection Time   04/16/11  4:11 AM      Component Value Range Comment   Phosphorus 3.1  2.3 - 4.6 (mg/dL)   COMPREHENSIVE METABOLIC PANEL     Status: Abnormal   Collection Time   04/16/11  4:11 AM      Component Value Range Comment   Sodium 137  135 - 145 (mEq/L)    Potassium 3.7  3.5 - 5.1 (mEq/L)    Chloride 100  96 - 112 (mEq/L)    CO2 27  19 - 32 (mEq/L)    Glucose, Bld 94  70 - 99 (mg/dL)    BUN 32 (*) 6 - 23 (mg/dL)    Creatinine, Ser 4.54  0.50 - 1.35 (mg/dL)    Calcium 8.9  8.4 - 10.5 (mg/dL)    Total Protein 6.7  6.0 - 8.3 (  g/dL)    Albumin 2.6 (*) 3.5 - 5.2 (g/dL)    AST 36  0 - 37 (U/L)    ALT 30  0 - 53 (U/L)    Alkaline Phosphatase 97  39 - 117 (U/L)    Total Bilirubin 0.4  0.3 - 1.2 (mg/dL)    GFR calc non Af Amer 55 (*) >90 (mL/min)    GFR calc Af Amer 63 (*) >90 (mL/min)   CBC     Status: Abnormal   Collection Time   04/16/11  4:11 AM      Component Value Range Comment   WBC 9.3  4.0 - 10.5 (K/uL)    RBC 3.66 (*) 4.22 - 5.81 (MIL/uL)    Hemoglobin 11.8 (*) 13.0 - 17.0 (g/dL)    HCT 16.1 (*) 09.6 - 52.0 (%)    MCV 95.1  78.0 - 100.0 (fL)    MCH 32.2  26.0 - 34.0 (pg)    MCHC 33.9  30.0 - 36.0 (g/dL)    RDW 04.5  40.9 - 81.1 (%)    Platelets 225  150 - 400 (K/uL)   DIFFERENTIAL     Status: Abnormal   Collection Time   04/16/11  4:11 AM      Component Value Range Comment   Neutrophils Relative 80 (*) 43 - 77  (%)    Neutro Abs 7.4  1.7 - 7.7 (K/uL)    Lymphocytes Relative 8 (*) 12 - 46 (%)    Lymphs Abs 0.7  0.7 - 4.0 (K/uL)    Monocytes Relative 12  3 - 12 (%)    Monocytes Absolute 1.1 (*) 0.1 - 1.0 (K/uL)    Eosinophils Relative 0  0 - 5 (%)    Eosinophils Absolute 0.0  0.0 - 0.7 (K/uL)    Basophils Relative 0  0 - 1 (%)    Basophils Absolute 0.0  0.0 - 0.1 (K/uL)   PROTIME-INR     Status: Normal   Collection Time   04/16/11  4:11 AM      Component Value Range Comment   Prothrombin Time 14.2  11.6 - 15.2 (seconds)    INR 1.08  0.00 - 1.49    APTT     Status: Abnormal   Collection Time   04/16/11  4:11 AM      Component Value Range Comment   aPTT 38 (*) 24 - 37 (seconds)   PRO B NATRIURETIC PEPTIDE     Status: Abnormal   Collection Time   04/16/11  4:11 AM      Component Value Range Comment   BNP, POC 1906.0 (*) 0 - 450 (pg/mL)     Micro Results: No results found for this or any previous visit (from the past 240 hour(s)).  Studies/Results: Dg Chest 2 View  04/15/2011  *RADIOLOGY REPORT*  Clinical Data: Cough with shortness of breath  CHEST - 2 VIEW  Comparison: 04/06/2011.  Findings: Cardiomegaly.  Marked worsening aeration compared with priors with new areas of airspace opacity in the left mid and lower lung zones.  Findings consistent with development of acute bacterial pneumonia likely involving the left lower lobe.  Moderate hyperinflation suggesting COPD.  Calcified tortuous aorta. Osteopenia.  IMPRESSION:  Left mid and lower lung zone infiltrates as described. Cardiomegaly.  These are new from 04/06/2011.  The the  Original Report Authenticated By: Elsie Stain, M.D.   Dg Chest 2 View  04/06/2011  *RADIOLOGY REPORT*  Clinical Data: Fall.  CHEST - 2 VIEW  Comparison: PA and lateral chest 06/21/2009.  Findings: Lungs are clear.  Heart size is normal.  No pneumothorax or pleural effusion.  No focal bony abnormality.  IMPRESSION: No acute disease.  Original Report Authenticated By:  Bernadene Bell. Maricela Curet, M.D.   Dg Pelvis 1-2 Views  04/06/2011  *RADIOLOGY REPORT*  Clinical Data: Fall, pain.  PELVIS - 1-2 VIEW  Comparison: None.  Findings: Hips are located.  No fracture.  There is some degenerative change about the hips.  Soft tissues unremarkable.  IMPRESSION: No acute finding.  Original Report Authenticated By: Bernadene Bell. Maricela Curet, M.D.   Ct Head Wo Contrast  04/16/2011  *RADIOLOGY REPORT*  Clinical Data: Evaluate for hemorrhage.  CT HEAD WITHOUT CONTRAST  Technique:  Contiguous axial images were obtained from the base of the skull through the vertex without contrast.  Comparison: Head CT 04/06/2011  Findings: Stable appearance of the brain.  Chronic microvascular ischemic changes in the periventricular white matter.  Negative for hemorrhage, hydrocephalus, mass effect, mass lesion, or evidence of acute infarction.  The visualized paranasal sinuses, mastoid air cells, and middle ears are clear.  The skull is intact  IMPRESSION: No acute intracranial abnormality.  Chronic microvascular ischemic changes.  Original Report Authenticated By: Britta Mccreedy, M.D.   Ct Head Wo Contrast  04/06/2011  *RADIOLOGY REPORT*  Clinical Data:  Fall, head injury  CT HEAD WITHOUT CONTRAST CT CERVICAL SPINE WITHOUT CONTRAST  Technique:  Multidetector CT imaging of the head and cervical spine was performed following the standard protocol without intravenous contrast.  Multiplanar CT image reconstructions of the cervical spine were also generated.  Comparison:  01/13/2009  CT HEAD  Findings: Motion artifacts, which repeat imaging was performed. Mild atrophy. Normal ventricular morphology. No midline shift or mass effect. Small vessel chronic ischemic changes of deep cerebral white matter. No intracranial hemorrhage, mass lesion or evidence of acute infarction. No definite extra-axial fluid collections. Bones appear demineralized. Visualized paranasal sinuses and mastoid air cells clear. No acute calvarial  abnormality.  IMPRESSION: Atrophy with small vessel chronic ischemic changes of deep cerebral white matter. No acute intracranial abnormalities.  CT CERVICAL SPINE  Findings: Osseous demineralization. Visualized skull base intact. Lung apices clear. Prevertebral soft tissues normal thickness. Disc space narrowing endplate spur formation C6-C7. Minimal scattered facet degenerative changes. Vertebral body heights maintained without fracture or subluxation. No bone destruction.  IMPRESSION: Minimal degenerative disc and facet disease changes of the cervical spine. Osseous demineralization. No acute bony abnormalities.  Original Report Authenticated By: Lollie Marrow, M.D.   Ct Cervical Spine Wo Contrast  04/06/2011  *RADIOLOGY REPORT*  Clinical Data:  Fall, head injury  CT HEAD WITHOUT CONTRAST CT CERVICAL SPINE WITHOUT CONTRAST  Technique:  Multidetector CT imaging of the head and cervical spine was performed following the standard protocol without intravenous contrast.  Multiplanar CT image reconstructions of the cervical spine were also generated.  Comparison:  01/13/2009  CT HEAD  Findings: Motion artifacts, which repeat imaging was performed. Mild atrophy. Normal ventricular morphology. No midline shift or mass effect. Small vessel chronic ischemic changes of deep cerebral white matter. No intracranial hemorrhage, mass lesion or evidence of acute infarction. No definite extra-axial fluid collections. Bones appear demineralized. Visualized paranasal sinuses and mastoid air cells clear. No acute calvarial abnormality.  IMPRESSION: Atrophy with small vessel chronic ischemic changes of deep cerebral white matter. No acute intracranial abnormalities.  CT CERVICAL SPINE  Findings: Osseous demineralization. Visualized skull base intact. Lung apices clear.  Prevertebral soft tissues normal thickness. Disc space narrowing endplate spur formation C6-C7. Minimal scattered facet degenerative changes. Vertebral body heights  maintained without fracture or subluxation. No bone destruction.  IMPRESSION: Minimal degenerative disc and facet disease changes of the cervical spine. Osseous demineralization. No acute bony abnormalities.  Original Report Authenticated By: Lollie Marrow, M.D.   Dg Knee Complete 4 Views Right  04/06/2011  *RADIOLOGY REPORT*  Clinical Data: Fall, pain.  Laceration.  RIGHT KNEE - COMPLETE 4+ VIEW  Comparison: None.  Findings: No fracture, dislocation or joint effusion is identified. No radiopaque foreign body.  IMPRESSION: No acute finding.  Original Report Authenticated By: Bernadene Bell. D'ALESSIO, M.D.   Dg Humerus Right  04/06/2011  *RADIOLOGY REPORT*  Clinical Data: Fall, pain.  RIGHT HUMERUS - 2+ VIEW  Comparison: None.  Findings: No acute bony or joint abnormality is identified.  No marked degenerative change.  IMPRESSION: Negative exam.  Original Report Authenticated By: Bernadene Bell. Maricela Curet, M.D.   Medications: Scheduled Meds:   . albuterol  2.5 mg Nebulization Q6H  . azithromycin  500 mg Intravenous Q24H  . azithromycin  500 mg Oral Once  . cefTRIAXone (ROCEPHIN)  IV  1 g Intravenous Q24H  . enoxaparin  40 mg Subcutaneous Q24H  . finasteride  5 mg Oral Daily  . ipratropium  0.5 mg Nebulization Q6H  . metoprolol tartrate  12.5 mg Oral Q6H  . pantoprazole  40 mg Oral Q1200  . DISCONTD: cefTRIAXone (ROCEPHIN)  IV  1 g Intravenous Q24H   Continuous Infusions:   . sodium chloride 75 mL/hr at 04/16/11 0601   PRN Meds:.  Assessment/Plan: #1 Pneumonia-will continue iv rocephin and zithromax and will add nebulizer treatment #2 Atrial fib/flutter, transient-Patient now in normal rhythm.Will order magnesiun level #3 Hyponatremia-will rehydrate patient gently with normal saline #4 Anemia-will monitor H&H #5 Dehydration-gentle iv hydration with normal saline #6 HTN-Bp is stable   LOS: 1 day   Paulla Mcclaskey 04/16/2011, 8:29 AM

## 2011-04-16 NOTE — ED Notes (Signed)
Pt a/ox3, denies pain, talking on the phone with his wife

## 2011-04-16 NOTE — ED Notes (Signed)
Notified resp of neb tx  

## 2011-04-16 NOTE — ED Notes (Signed)
Family at bedside. 

## 2011-04-16 NOTE — ED Notes (Signed)
When a md rounds on this patient, we need to discuss a possible swallow evaluation, changing diet to thickened and discussing the possibility of getting this patient a walker

## 2011-04-16 NOTE — ED Notes (Signed)
MD at bedside. 

## 2011-04-16 NOTE — ED Notes (Signed)
Cd resp. For neb tx

## 2011-04-17 LAB — DIFFERENTIAL
Eosinophils Absolute: 0.1 10*3/uL (ref 0.0–0.7)
Eosinophils Relative: 1 % (ref 0–5)
Lymphs Abs: 0.7 10*3/uL (ref 0.7–4.0)
Monocytes Relative: 13 % — ABNORMAL HIGH (ref 3–12)

## 2011-04-17 LAB — CBC
Hemoglobin: 10.5 g/dL — ABNORMAL LOW (ref 13.0–17.0)
MCH: 31.5 pg (ref 26.0–34.0)
MCV: 92.5 fL (ref 78.0–100.0)
RBC: 3.33 MIL/uL — ABNORMAL LOW (ref 4.22–5.81)

## 2011-04-17 LAB — COMPREHENSIVE METABOLIC PANEL
Alkaline Phosphatase: 90 U/L (ref 39–117)
BUN: 20 mg/dL (ref 6–23)
CO2: 24 mEq/L (ref 19–32)
GFR calc Af Amer: 88 mL/min — ABNORMAL LOW (ref >90)
GFR calc non Af Amer: 76 mL/min — ABNORMAL LOW (ref >90)
Glucose, Bld: 117 mg/dL — ABNORMAL HIGH (ref 70–99)
Potassium: 3.2 mEq/L — ABNORMAL LOW (ref 3.5–5.1)
Total Protein: 6.1 g/dL (ref 6.0–8.3)

## 2011-04-17 MED ORDER — POTASSIUM CHLORIDE CRYS ER 10 MEQ PO TBCR
10.0000 meq | EXTENDED_RELEASE_TABLET | Freq: Two times a day (BID) | ORAL | Status: DC
  Administered 2011-04-17 – 2011-04-20 (×7): 10 meq via ORAL
  Filled 2011-04-17 (×8): qty 1

## 2011-04-17 MED ORDER — ALBUTEROL SULFATE (5 MG/ML) 0.5% IN NEBU
2.5000 mg | INHALATION_SOLUTION | Freq: Two times a day (BID) | RESPIRATORY_TRACT | Status: DC
  Administered 2011-04-18 – 2011-04-19 (×3): 2.5 mg via RESPIRATORY_TRACT
  Filled 2011-04-17 (×4): qty 0.5

## 2011-04-17 MED ORDER — IPRATROPIUM BROMIDE 0.02 % IN SOLN
0.5000 mg | Freq: Two times a day (BID) | RESPIRATORY_TRACT | Status: DC
  Administered 2011-04-18 – 2011-04-19 (×3): 0.5 mg via RESPIRATORY_TRACT
  Filled 2011-04-17 (×5): qty 2.5

## 2011-04-17 MED ORDER — ALBUTEROL SULFATE (5 MG/ML) 0.5% IN NEBU
2.5000 mg | INHALATION_SOLUTION | Freq: Four times a day (QID) | RESPIRATORY_TRACT | Status: DC | PRN
  Filled 2011-04-17: qty 0.5

## 2011-04-17 NOTE — Progress Notes (Signed)
Patient ID: Tim Black, male   DOB: 1923-06-29, 77 y.o.   MRN: [id number] Patient ID: Tim Black, male   DOB: 12-18-1923, 77 y.o.   MRN: [id number] Subjective: Patient seen.Feels better.Denies any specific complaints  Objective: Weight change:   Intake/Output Summary (Last 24 hours) at 04/17/11 0658 Last data filed at 04/17/11 0500  Gross per 24 hour  Intake      0 ml  Output    200 ml  Net   -200 ml   BP 136/75  Pulse 69  Temp(Src) 97.7 F (36.5 C) (Oral)  Resp 16  Ht 5\' 7"  (1.702 m)  Wt 62.37 kg (137 lb 8 oz)  BMI 21.54 kg/m2  SpO2 91% Physical Exam: General appearance: alert, cooperative and no distress,improve dehydration Head: Normocephalic, without obvious abnormality, atraumatic Neck: no adenopathy, no carotid bruit, no JVD, supple, symmetrical, trachea midline and thyroid not enlarged, symmetric, no tenderness/mass/nodules Lungs:minimal  crackles lower zones of the lungs bilaterally Heart: regular rate and rhythm, S1, S2 normal, no murmur, click, rub or gallop Abdomen: soft, non-tender; bowel sounds normal; no masses,  no organomegaly Extremities: extremities normal, atraumatic, no cyanosis or edema Skin: improved turgor  Lab Results: Results for orders placed during the hospital encounter of 04/15/11 (from the past 48 hour(s))  PRO B NATRIURETIC PEPTIDE     Status: Abnormal   Collection Time   04/15/11  7:29 PM      Component Value Range Comment   BNP, POC 1594.0 (*) 0 - 450 (pg/mL)   CBC     Status: Abnormal   Collection Time   04/15/11  7:43 PM      Component Value Range Comment   WBC 10.7 (*) 4.0 - 10.5 (K/uL)    RBC 3.73 (*) 4.22 - 5.81 (MIL/uL)    Hemoglobin 12.1 (*) 13.0 - 17.0 (g/dL)    HCT 78.2 (*) 95.6 - 52.0 (%)    MCV 94.6  78.0 - 100.0 (fL)    MCH 32.4  26.0 - 34.0 (pg)    MCHC 34.3  30.0 - 36.0 (g/dL)    RDW 21.3  08.6 - 57.8 (%)    Platelets 222  150 - 400 (K/uL)   DIFFERENTIAL     Status: Abnormal   Collection Time   04/15/11  7:43  PM      Component Value Range Comment   Neutrophils Relative 83 (*) 43 - 77 (%)    Neutro Abs 8.9 (*) 1.7 - 7.7 (K/uL)    Lymphocytes Relative 7 (*) 12 - 46 (%)    Lymphs Abs 0.8  0.7 - 4.0 (K/uL)    Monocytes Relative 9  3 - 12 (%)    Monocytes Absolute 1.0  0.1 - 1.0 (K/uL)    Eosinophils Relative 0  0 - 5 (%)    Eosinophils Absolute 0.0  0.0 - 0.7 (K/uL)    Basophils Relative 0  0 - 1 (%)    Basophils Absolute 0.0  0.0 - 0.1 (K/uL)   BASIC METABOLIC PANEL     Status: Abnormal   Collection Time   04/15/11  7:43 PM      Component Value Range Comment   Sodium 131 (*) 135 - 145 (mEq/L)    Potassium 3.9  3.5 - 5.1 (mEq/L)    Chloride 98  96 - 112 (mEq/L)    CO2 23  19 - 32 (mEq/L)    Glucose, Bld 102 (*) 70 - 99 (mg/dL)    BUN 37 (*)  6 - 23 (mg/dL)    Creatinine, Ser 1.61  0.50 - 1.35 (mg/dL)    Calcium 9.0  8.4 - 10.5 (mg/dL)    GFR calc non Af Amer 51 (*) >90 (mL/min)    GFR calc Af Amer 59 (*) >90 (mL/min)   PROTIME-INR     Status: Normal   Collection Time   04/15/11  7:43 PM      Component Value Range Comment   Prothrombin Time 13.8  11.6 - 15.2 (seconds)    INR 1.04  0.00 - 1.49    TROPONIN I     Status: Normal   Collection Time   04/15/11  7:43 PM      Component Value Range Comment   Troponin I <0.30  <0.30 (ng/mL)   APTT     Status: Normal   Collection Time   04/15/11  7:43 PM      Component Value Range Comment   aPTT 37  24 - 37 (seconds)   URINALYSIS, ROUTINE W REFLEX MICROSCOPIC     Status: Abnormal   Collection Time   04/16/11  2:19 AM      Component Value Range Comment   Color, Urine YELLOW  YELLOW     APPearance CLEAR  CLEAR     Specific Gravity, Urine 1.019  1.005 - 1.030     pH 5.5  5.0 - 8.0     Glucose, UA NEGATIVE  NEGATIVE (mg/dL)    Hgb urine dipstick LARGE (*) NEGATIVE     Bilirubin Urine NEGATIVE  NEGATIVE     Ketones, ur NEGATIVE  NEGATIVE (mg/dL)    Protein, ur NEGATIVE  NEGATIVE (mg/dL)    Urobilinogen, UA 1.0  0.0 - 1.0 (mg/dL)    Nitrite  NEGATIVE  NEGATIVE     Leukocytes, UA NEGATIVE  NEGATIVE    URINE MICROSCOPIC-ADD ON     Status: Normal   Collection Time   04/16/11  2:19 AM      Component Value Range Comment   WBC, UA 0-2  <3 (WBC/hpf)    RBC / HPF 7-10  <3 (RBC/hpf)   CARDIAC PANEL(CRET KIN+CKTOT+MB+TROPI)     Status: Normal   Collection Time   04/16/11  2:47 AM      Component Value Range Comment   Total CK 142  7 - 232 (U/L)    CK, MB 3.4  0.3 - 4.0 (ng/mL)    Troponin I <0.30  <0.30 (ng/mL)    Relative Index 2.4  0.0 - 2.5    MAGNESIUM     Status: Normal   Collection Time   04/16/11  4:11 AM      Component Value Range Comment   Magnesium 2.1  1.5 - 2.5 (mg/dL)   PHOSPHORUS     Status: Normal   Collection Time   04/16/11  4:11 AM      Component Value Range Comment   Phosphorus 3.1  2.3 - 4.6 (mg/dL)   COMPREHENSIVE METABOLIC PANEL     Status: Abnormal   Collection Time   04/16/11  4:11 AM      Component Value Range Comment   Sodium 137  135 - 145 (mEq/L)    Potassium 3.7  3.5 - 5.1 (mEq/L)    Chloride 100  96 - 112 (mEq/L)    CO2 27  19 - 32 (mEq/L)    Glucose, Bld 94  70 - 99 (mg/dL)    BUN 32 (*) 6 - 23 (mg/dL)  Creatinine, Ser 1.18  0.50 - 1.35 (mg/dL)    Calcium 8.9  8.4 - 10.5 (mg/dL)    Total Protein 6.7  6.0 - 8.3 (g/dL)    Albumin 2.6 (*) 3.5 - 5.2 (g/dL)    AST 36  0 - 37 (U/L)    ALT 30  0 - 53 (U/L)    Alkaline Phosphatase 97  39 - 117 (U/L)    Total Bilirubin 0.4  0.3 - 1.2 (mg/dL)    GFR calc non Af Amer 55 (*) >90 (mL/min)    GFR calc Af Amer 63 (*) >90 (mL/min)   CBC     Status: Abnormal   Collection Time   04/16/11  4:11 AM      Component Value Range Comment   WBC 9.3  4.0 - 10.5 (K/uL)    RBC 3.66 (*) 4.22 - 5.81 (MIL/uL)    Hemoglobin 11.8 (*) 13.0 - 17.0 (g/dL)    HCT 16.1 (*) 09.6 - 52.0 (%)    MCV 95.1  78.0 - 100.0 (fL)    MCH 32.2  26.0 - 34.0 (pg)    MCHC 33.9  30.0 - 36.0 (g/dL)    RDW 04.5  40.9 - 81.1 (%)    Platelets 225  150 - 400 (K/uL)   DIFFERENTIAL      Status: Abnormal   Collection Time   04/16/11  4:11 AM      Component Value Range Comment   Neutrophils Relative 80 (*) 43 - 77 (%)    Neutro Abs 7.4  1.7 - 7.7 (K/uL)    Lymphocytes Relative 8 (*) 12 - 46 (%)    Lymphs Abs 0.7  0.7 - 4.0 (K/uL)    Monocytes Relative 12  3 - 12 (%)    Monocytes Absolute 1.1 (*) 0.1 - 1.0 (K/uL)    Eosinophils Relative 0  0 - 5 (%)    Eosinophils Absolute 0.0  0.0 - 0.7 (K/uL)    Basophils Relative 0  0 - 1 (%)    Basophils Absolute 0.0  0.0 - 0.1 (K/uL)   PROTIME-INR     Status: Normal   Collection Time   04/16/11  4:11 AM      Component Value Range Comment   Prothrombin Time 14.2  11.6 - 15.2 (seconds)    INR 1.08  0.00 - 1.49    APTT     Status: Abnormal   Collection Time   04/16/11  4:11 AM      Component Value Range Comment   aPTT 38 (*) 24 - 37 (seconds)   PRO B NATRIURETIC PEPTIDE     Status: Abnormal   Collection Time   04/16/11  4:11 AM      Component Value Range Comment   BNP, POC 1906.0 (*) 0 - 450 (pg/mL)     Micro Results: No results found for this or any previous visit (from the past 240 hour(s)).  Studies/Results: Dg Chest 2 View  04/15/2011  *RADIOLOGY REPORT*  Clinical Data: Cough with shortness of breath  CHEST - 2 VIEW  Comparison: 04/06/2011.  Findings: Cardiomegaly.  Marked worsening aeration compared with priors with new areas of airspace opacity in the left mid and lower lung zones.  Findings consistent with development of acute bacterial pneumonia likely involving the left lower lobe.  Moderate hyperinflation suggesting COPD.  Calcified tortuous aorta. Osteopenia.  IMPRESSION:  Left mid and lower lung zone infiltrates as described. Cardiomegaly.  These are new from 04/06/2011.  The the  Original Report Authenticated By: Elsie Stain, M.D.   Dg Chest 2 View  04/06/2011  *RADIOLOGY REPORT*  Clinical Data: Fall.  CHEST - 2 VIEW  Comparison: PA and lateral chest 06/21/2009.  Findings: Lungs are clear.  Heart size is normal.   No pneumothorax or pleural effusion.  No focal bony abnormality.  IMPRESSION: No acute disease.  Original Report Authenticated By: Bernadene Bell. Maricela Curet, M.D.   Dg Pelvis 1-2 Views  04/06/2011  *RADIOLOGY REPORT*  Clinical Data: Fall, pain.  PELVIS - 1-2 VIEW  Comparison: None.  Findings: Hips are located.  No fracture.  There is some degenerative change about the hips.  Soft tissues unremarkable.  IMPRESSION: No acute finding.  Original Report Authenticated By: Bernadene Bell. Maricela Curet, M.D.   Ct Head Wo Contrast  04/16/2011  *RADIOLOGY REPORT*  Clinical Data: Evaluate for hemorrhage.  CT HEAD WITHOUT CONTRAST  Technique:  Contiguous axial images were obtained from the base of the skull through the vertex without contrast.  Comparison: Head CT 04/06/2011  Findings: Stable appearance of the brain.  Chronic microvascular ischemic changes in the periventricular white matter.  Negative for hemorrhage, hydrocephalus, mass effect, mass lesion, or evidence of acute infarction.  The visualized paranasal sinuses, mastoid air cells, and middle ears are clear.  The skull is intact  IMPRESSION: No acute intracranial abnormality.  Chronic microvascular ischemic changes.  Original Report Authenticated By: Britta Mccreedy, M.D.   Ct Head Wo Contrast  04/06/2011  *RADIOLOGY REPORT*  Clinical Data:  Fall, head injury  CT HEAD WITHOUT CONTRAST CT CERVICAL SPINE WITHOUT CONTRAST  Technique:  Multidetector CT imaging of the head and cervical spine was performed following the standard protocol without intravenous contrast.  Multiplanar CT image reconstructions of the cervical spine were also generated.  Comparison:  01/13/2009  CT HEAD  Findings: Motion artifacts, which repeat imaging was performed. Mild atrophy. Normal ventricular morphology. No midline shift or mass effect. Small vessel chronic ischemic changes of deep cerebral white matter. No intracranial hemorrhage, mass lesion or evidence of acute infarction. No definite  extra-axial fluid collections. Bones appear demineralized. Visualized paranasal sinuses and mastoid air cells clear. No acute calvarial abnormality.  IMPRESSION: Atrophy with small vessel chronic ischemic changes of deep cerebral white matter. No acute intracranial abnormalities.  CT CERVICAL SPINE  Findings: Osseous demineralization. Visualized skull base intact. Lung apices clear. Prevertebral soft tissues normal thickness. Disc space narrowing endplate spur formation C6-C7. Minimal scattered facet degenerative changes. Vertebral body heights maintained without fracture or subluxation. No bone destruction.  IMPRESSION: Minimal degenerative disc and facet disease changes of the cervical spine. Osseous demineralization. No acute bony abnormalities.  Original Report Authenticated By: Lollie Marrow, M.D.   Ct Cervical Spine Wo Contrast  04/06/2011  *RADIOLOGY REPORT*  Clinical Data:  Fall, head injury  CT HEAD WITHOUT CONTRAST CT CERVICAL SPINE WITHOUT CONTRAST  Technique:  Multidetector CT imaging of the head and cervical spine was performed following the standard protocol without intravenous contrast.  Multiplanar CT image reconstructions of the cervical spine were also generated.  Comparison:  01/13/2009  CT HEAD  Findings: Motion artifacts, which repeat imaging was performed. Mild atrophy. Normal ventricular morphology. No midline shift or mass effect. Small vessel chronic ischemic changes of deep cerebral white matter. No intracranial hemorrhage, mass lesion or evidence of acute infarction. No definite extra-axial fluid collections. Bones appear demineralized. Visualized paranasal sinuses and mastoid air cells clear. No acute calvarial abnormality.  IMPRESSION: Atrophy with small vessel  chronic ischemic changes of deep cerebral white matter. No acute intracranial abnormalities.  CT CERVICAL SPINE  Findings: Osseous demineralization. Visualized skull base intact. Lung apices clear. Prevertebral soft tissues  normal thickness. Disc space narrowing endplate spur formation C6-C7. Minimal scattered facet degenerative changes. Vertebral body heights maintained without fracture or subluxation. No bone destruction.  IMPRESSION: Minimal degenerative disc and facet disease changes of the cervical spine. Osseous demineralization. No acute bony abnormalities.  Original Report Authenticated By: Lollie Marrow, M.D.   Dg Knee Complete 4 Views Right  04/06/2011  *RADIOLOGY REPORT*  Clinical Data: Fall, pain.  Laceration.  RIGHT KNEE - COMPLETE 4+ VIEW  Comparison: None.  Findings: No fracture, dislocation or joint effusion is identified. No radiopaque foreign body.  IMPRESSION: No acute finding.  Original Report Authenticated By: Bernadene Bell. D'ALESSIO, M.D.   Dg Humerus Right  04/06/2011  *RADIOLOGY REPORT*  Clinical Data: Fall, pain.  RIGHT HUMERUS - 2+ VIEW  Comparison: None.  Findings: No acute bony or joint abnormality is identified.  No marked degenerative change.  IMPRESSION: Negative exam.  Original Report Authenticated By: Bernadene Bell. Maricela Curet, M.D.   Medications: Scheduled Meds:   . albuterol  2.5 mg Nebulization Q6H  . azithromycin  500 mg Intravenous Q24H  . azithromycin  500 mg Oral Once  . cefTRIAXone (ROCEPHIN)  IV  1 g Intravenous Q24H  . enoxaparin  40 mg Subcutaneous Q24H  . finasteride  5 mg Oral Daily  . ipratropium  0.5 mg Nebulization Q6H  . metoprolol tartrate  12.5 mg Oral Q6H  . pantoprazole  40 mg Oral Q1200  . DISCONTD: cefTRIAXone (ROCEPHIN)  IV  1 g Intravenous Q24H   Continuous Infusions:    . sodium chloride 75 mL/hr at 04/16/11 0601   PRN Meds:.  Assessment/Plan: #1 Pneumonia-will continue iv rocephin and zithromax with nebulizer treatment #2 Atrial fib/flutter, transient-Patient now in normal rhythm. #3 Hyponatremia-sodium level is normal #4 Anemia-will monitor H&H #5 Dehydration-hydration improved #6 HTN-Bp is stable #7 Hypokalemia-K repletion   LOS: 2 days    Dionisios Ricci 04/17/2011, 6:58 AM

## 2011-04-17 NOTE — Progress Notes (Signed)
Physical Therapy Evaluation Patient Details Name: Tim Black MRN: [id number] DOB: Dec 15, 1923 Today's Date: 04/17/2011 1505-1600 Ev2 1Gt Problem List:  Patient Active Problem List  Diagnoses  . Pneumonia  . Atrial fib/flutter, transient  . Hyponatremia  . Anemia  . Dehydration    Past Medical History:  Past Medical History  Diagnosis Date  . Hypertension   . GERD (gastroesophageal reflux disease)   . BPH (benign prostatic hyperplasia)   . Diabetes mellitus     diet controlled, mild  . Fall down stairs 03/2012   Past Surgical History:  Past Surgical History  Procedure Date  . Hernia repair   . Cataract extraction 2010    both eyes    PT Assessment/Plan/Recommendation PT Assessment Clinical Impression Statement: Patient with pneumonia and h/o increased recent falls.  Has decreased safety awarness with resistance to using walker.  Wife concerned and wants pt to have walker.  Discussed out patient balance rehab with forms given for referral when recovered from pneumonia.  Will need HHPT initially for safety and improved mobility. PT Recommendation/Assessment: Patient will need skilled PT in the acute care venue PT Problem List: Decreased safety awareness;Decreased balance;Decreased mobility;Decreased knowledge of use of DME PT Therapy Diagnosis : Abnormality of gait;Generalized weakness PT Plan PT Frequency: Min 3X/week PT Treatment/Interventions: Stair training;Gait training;DME instruction;Patient/family education;Therapeutic exercise;Balance training PT Recommendation Recommendations for Other Services: OT consult Follow Up Recommendations: Home health PT Equipment Recommended: Rolling walker with 5" wheels PT Goals  Acute Rehab PT Goals PT Goal Formulation: With patient Time For Goal Achievement: 7 days Pt will go Sit to Stand: with modified independence PT Goal: Sit to Stand - Progress: Progressing toward goal Pt will go Stand to Sit: with modified  independence PT Goal: Stand to Sit - Progress: Progressing toward goal Pt will Ambulate: >150 feet;with modified independence;with rolling walker PT Goal: Ambulate - Progress: Progressing toward goal Pt will Go Up / Down Stairs: 3-5 stairs;with rail(s);with supervision PT Goal: Up/Down Stairs - Progress: Other (comment) (not addressed on eval)  PT Evaluation Precautions/Restrictions  Precautions Precautions: Fall Prior Functioning  Home Living Lives With: Significant other Receives Help From: Family Type of Home: House Home Layout: One level Home Access: Stairs to enter Entrance Stairs-Rails: Right Entrance Stairs-Number of Steps: back 4 with rail; front 5 no rail Prior Function Level of Independence: Independent with basic ADLs;Independent with transfers;Independent with gait Comments: increased frequency of falls at home lately.  struck head with fall down steps at Thanksgiving.Marland KitchenMarland KitchenHead CT negative Cognition Cognition Arousal/Alertness: Awake/alert Overall Cognitive Status: Impaired Orientation Level: Oriented X4 Following Commands: Appears intact for tasks assessed;Follows multi-step commands consistently Safety/Judgement: Decreased awareness of safety precautions;Decreased safety judgement for tasks assessed Decreased Safety/Judgement: Decreased awareness of need for assistance Safety/Judgement - Other Comments: educated in need to call for help and use walker full time upon d/c...resistive to using walker... educated wife to inform nursing staff when she leaves due to patient in chair and no chair alarm Sensation/Coordination Coordination Gross Motor Movements are Fluid and Coordinated: No Coordination and Movement Description: decreased coordination right UE as compared to left UE with pronation/supination and finger to nose with dysmetria, decreased with alternate toe tapping  Extremity Assessment RLE Assessment RLE Assessment: Within Functional Limits LLE Assessment LLE  Assessment: Within Functional Limits Mobility (including Balance) Bed Mobility Bed Mobility: Yes Supine to Sit: 6: Modified independent (Device/Increase time) Transfers Transfers: Yes Sit to Stand: 5: Supervision;From bed;With upper extremity assist Sit to Stand Details (indicate cue type and reason): for  safety due to decreased balance in standing unsupported Stand to Sit: 5: Supervision;To chair/3-in-1;With armrests Stand to Sit Details: for safety due to chair unlocked Ambulation/Gait Ambulation/Gait Assistance: 4: Min assist Ambulation/Gait Assistance Details (indicate cue type and reason): due to lateral lean to left and decreased balance with head turns Ambulation Distance (Feet): 400 Feet Assistive device: Rolling walker  Posture/Postural Control Posture/Postural Control: Postural limitations Postural Limitations: kyphosis with forward head Balance Balance Assessed: Yes Static Standing Balance Static Standing - Balance Support: No upper extremity supported Static Standing - Level of Assistance: 5: Stand by assistance Static Standing - Comment/# of Minutes: 30 seconds at bedside before walking in hallway Dynamic Standing Balance Dynamic Standing - Balance Support: Bilateral upper extremity supported Dynamic Standing - Level of Assistance: 4: Min assist Dynamic Standing - Comments: gait with head turns Exercise    End of Session PT - End of Session Equipment Utilized During Treatment: Gait belt Activity Tolerance: Patient tolerated treatment well Patient left: in chair;with family/visitor present;with call bell in reach;Other (comment) Nurse Communication: Mobility status for ambulation;Other (comment) (no chair alarm, wife present, will inform staff at departure) General Behavior During Session: [facility] for tasks performed Cognition: [facility] for tasks performed (except impaired safety awareness)  WYNN,CYNDI 04/17/2011, 4:31 PM

## 2011-04-17 NOTE — Progress Notes (Signed)
*  PRELIMINARY RESULTS* Echocardiogram 2D Echocardiogram has been performed.  Cathie Beams Deneen 04/17/2011, 11:40 AM

## 2011-04-18 LAB — COMPREHENSIVE METABOLIC PANEL
ALT: 36 U/L (ref 0–53)
AST: 38 U/L — ABNORMAL HIGH (ref 0–37)
Albumin: 2.1 g/dL — ABNORMAL LOW (ref 3.5–5.2)
Alkaline Phosphatase: 88 U/L (ref 39–117)
CO2: 24 mEq/L (ref 19–32)
Chloride: 104 mEq/L (ref 96–112)
GFR calc non Af Amer: 77 mL/min — ABNORMAL LOW (ref >90)
Potassium: 3.9 mEq/L (ref 3.5–5.1)
Sodium: 136 mEq/L (ref 135–145)
Total Bilirubin: 0.4 mg/dL (ref 0.3–1.2)

## 2011-04-18 LAB — DIFFERENTIAL
Basophils Absolute: 0 10*3/uL (ref 0.0–0.1)
Basophils Relative: 0 % (ref 0–1)
Lymphocytes Relative: 12 % (ref 12–46)
Monocytes Absolute: 0.8 10*3/uL (ref 0.1–1.0)
Neutro Abs: 6.5 10*3/uL (ref 1.7–7.7)
Neutrophils Relative %: 77 % (ref 43–77)

## 2011-04-18 LAB — CBC
HCT: 31.9 % — ABNORMAL LOW (ref 39.0–52.0)
Platelets: 249 10*3/uL (ref 150–400)
RDW: 12.7 % (ref 11.5–15.5)
WBC: 8.5 10*3/uL (ref 4.0–10.5)

## 2011-04-18 NOTE — Progress Notes (Signed)
Patient ID: Tim Black, male   DOB: 10/30/1923, 77 y.o.   MRN: [id number] Patient ID: Deryl Giroux, male   DOB: October 21, 1923, 77 y.o.   MRN: [id number] Patient ID: Cletus Paris, male   DOB: [birth year], 77 y.o.   MRN: [id number] Subjective: Patient seen.Feels better.Denies any specific complaints.Patient could not recall that he was seen by me yesterday  Objective: Weight change:   Intake/Output Summary (Last 24 hours) at 04/18/11 1016 Last data filed at 04/18/11 0700  Gross per 24 hour  Intake   1350 ml  Output    375 ml  Net    975 ml   BP 137/63  Pulse 67  Temp(Src) 97.2 F (36.2 C) (Oral)  Resp 20  Ht 5\' 7"  (1.702 m)  Wt 62.37 kg (137 lb 8 oz)  BMI 21.54 kg/m2  SpO2 92% Physical Exam: General appearance: alert, cooperative and no distress,improve dehydration Head: Normocephalic, without obvious abnormality, atraumatic Neck: no adenopathy, no carotid bruit, no JVD, supple, symmetrical, trachea midline and thyroid not enlarged, symmetric, no tenderness/mass/nodules Lungs:minimal  clear lungs bilaterally Heart: regular rate and rhythm, S1, S2 normal, no murmur, click, rub or gallop Abdomen: soft, non-tender; bowel sounds normal; no masses,  no organomegaly Extremities: extremities normal, atraumatic, no cyanosis or edema Skin: improved turgor  Lab Results: Results for orders placed during the hospital encounter of 04/15/11 (from the past 48 hour(s))  PRO B NATRIURETIC PEPTIDE     Status: Abnormal   Collection Time   04/15/11  7:29 PM      Component Value Range Comment   BNP, POC 1594.0 (*) 0 - 450 (pg/mL)   CBC     Status: Abnormal   Collection Time   04/15/11  7:43 PM      Component Value Range Comment   WBC 10.7 (*) 4.0 - 10.5 (K/uL)    RBC 3.73 (*) 4.22 - 5.81 (MIL/uL)    Hemoglobin 12.1 (*) 13.0 - 17.0 (g/dL)    HCT 08.6 (*) 57.8 - 52.0 (%)    MCV 94.6  78.0 - 100.0 (fL)    MCH 32.4  26.0 - 34.0 (pg)    MCHC 34.3  30.0 - 36.0 (g/dL)    RDW 46.9  62.9 -  52.8 (%)    Platelets 222  150 - 400 (K/uL)   DIFFERENTIAL     Status: Abnormal   Collection Time   04/15/11  7:43 PM      Component Value Range Comment   Neutrophils Relative 83 (*) 43 - 77 (%)    Neutro Abs 8.9 (*) 1.7 - 7.7 (K/uL)    Lymphocytes Relative 7 (*) 12 - 46 (%)    Lymphs Abs 0.8  0.7 - 4.0 (K/uL)    Monocytes Relative 9  3 - 12 (%)    Monocytes Absolute 1.0  0.1 - 1.0 (K/uL)    Eosinophils Relative 0  0 - 5 (%)    Eosinophils Absolute 0.0  0.0 - 0.7 (K/uL)    Basophils Relative 0  0 - 1 (%)    Basophils Absolute 0.0  0.0 - 0.1 (K/uL)   BASIC METABOLIC PANEL     Status: Abnormal   Collection Time   04/15/11  7:43 PM      Component Value Range Comment   Sodium 131 (*) 135 - 145 (mEq/L)    Potassium 3.9  3.5 - 5.1 (mEq/L)    Chloride 98  96 - 112 (mEq/L)    CO2 23  19 -  32 (mEq/L)    Glucose, Bld 102 (*) 70 - 99 (mg/dL)    BUN 37 (*) 6 - 23 (mg/dL)    Creatinine, Ser 1.61  0.50 - 1.35 (mg/dL)    Calcium 9.0  8.4 - 10.5 (mg/dL)    GFR calc non Af Amer 51 (*) >90 (mL/min)    GFR calc Af Amer 59 (*) >90 (mL/min)   PROTIME-INR     Status: Normal   Collection Time   04/15/11  7:43 PM      Component Value Range Comment   Prothrombin Time 13.8  11.6 - 15.2 (seconds)    INR 1.04  0.00 - 1.49    TROPONIN I     Status: Normal   Collection Time   04/15/11  7:43 PM      Component Value Range Comment   Troponin I <0.30  <0.30 (ng/mL)   APTT     Status: Normal   Collection Time   04/15/11  7:43 PM      Component Value Range Comment   aPTT 37  24 - 37 (seconds)   URINALYSIS, ROUTINE W REFLEX MICROSCOPIC     Status: Abnormal   Collection Time   04/16/11  2:19 AM      Component Value Range Comment   Color, Urine YELLOW  YELLOW     APPearance CLEAR  CLEAR     Specific Gravity, Urine 1.019  1.005 - 1.030     pH 5.5  5.0 - 8.0     Glucose, UA NEGATIVE  NEGATIVE (mg/dL)    Hgb urine dipstick LARGE (*) NEGATIVE     Bilirubin Urine NEGATIVE  NEGATIVE     Ketones, ur NEGATIVE   NEGATIVE (mg/dL)    Protein, ur NEGATIVE  NEGATIVE (mg/dL)    Urobilinogen, UA 1.0  0.0 - 1.0 (mg/dL)    Nitrite NEGATIVE  NEGATIVE     Leukocytes, UA NEGATIVE  NEGATIVE    URINE MICROSCOPIC-ADD ON     Status: Normal   Collection Time   04/16/11  2:19 AM      Component Value Range Comment   WBC, UA 0-2  <3 (WBC/hpf)    RBC / HPF 7-10  <3 (RBC/hpf)   CARDIAC PANEL(CRET KIN+CKTOT+MB+TROPI)     Status: Normal   Collection Time   04/16/11  2:47 AM      Component Value Range Comment   Total CK 142  7 - 232 (U/L)    CK, MB 3.4  0.3 - 4.0 (ng/mL)    Troponin I <0.30  <0.30 (ng/mL)    Relative Index 2.4  0.0 - 2.5    MAGNESIUM     Status: Normal   Collection Time   04/16/11  4:11 AM      Component Value Range Comment   Magnesium 2.1  1.5 - 2.5 (mg/dL)   PHOSPHORUS     Status: Normal   Collection Time   04/16/11  4:11 AM      Component Value Range Comment   Phosphorus 3.1  2.3 - 4.6 (mg/dL)   COMPREHENSIVE METABOLIC PANEL     Status: Abnormal   Collection Time   04/16/11  4:11 AM      Component Value Range Comment   Sodium 137  135 - 145 (mEq/L)    Potassium 3.7  3.5 - 5.1 (mEq/L)    Chloride 100  96 - 112 (mEq/L)    CO2 27  19 - 32 (mEq/L)    Glucose,  Bld 94  70 - 99 (mg/dL)    BUN 32 (*) 6 - 23 (mg/dL)    Creatinine, Ser 7.82  0.50 - 1.35 (mg/dL)    Calcium 8.9  8.4 - 10.5 (mg/dL)    Total Protein 6.7  6.0 - 8.3 (g/dL)    Albumin 2.6 (*) 3.5 - 5.2 (g/dL)    AST 36  0 - 37 (U/L)    ALT 30  0 - 53 (U/L)    Alkaline Phosphatase 97  39 - 117 (U/L)    Total Bilirubin 0.4  0.3 - 1.2 (mg/dL)    GFR calc non Af Amer 55 (*) >90 (mL/min)    GFR calc Af Amer 63 (*) >90 (mL/min)   CBC     Status: Abnormal   Collection Time   04/16/11  4:11 AM      Component Value Range Comment   WBC 9.3  4.0 - 10.5 (K/uL)    RBC 3.66 (*) 4.22 - 5.81 (MIL/uL)    Hemoglobin 11.8 (*) 13.0 - 17.0 (g/dL)    HCT 95.6 (*) 21.3 - 52.0 (%)    MCV 95.1  78.0 - 100.0 (fL)    MCH 32.2  26.0 - 34.0 (pg)    MCHC  33.9  30.0 - 36.0 (g/dL)    RDW 08.6  57.8 - 46.9 (%)    Platelets 225  150 - 400 (K/uL)   DIFFERENTIAL     Status: Abnormal   Collection Time   04/16/11  4:11 AM      Component Value Range Comment   Neutrophils Relative 80 (*) 43 - 77 (%)    Neutro Abs 7.4  1.7 - 7.7 (K/uL)    Lymphocytes Relative 8 (*) 12 - 46 (%)    Lymphs Abs 0.7  0.7 - 4.0 (K/uL)    Monocytes Relative 12  3 - 12 (%)    Monocytes Absolute 1.1 (*) 0.1 - 1.0 (K/uL)    Eosinophils Relative 0  0 - 5 (%)    Eosinophils Absolute 0.0  0.0 - 0.7 (K/uL)    Basophils Relative 0  0 - 1 (%)    Basophils Absolute 0.0  0.0 - 0.1 (K/uL)   PROTIME-INR     Status: Normal   Collection Time   04/16/11  4:11 AM      Component Value Range Comment   Prothrombin Time 14.2  11.6 - 15.2 (seconds)    INR 1.08  0.00 - 1.49    APTT     Status: Abnormal   Collection Time   04/16/11  4:11 AM      Component Value Range Comment   aPTT 38 (*) 24 - 37 (seconds)   PRO B NATRIURETIC PEPTIDE     Status: Abnormal   Collection Time   04/16/11  4:11 AM      Component Value Range Comment   BNP, POC 1906.0 (*) 0 - 450 (pg/mL)     Micro Results: Recent Results (from the past 240 hour(s))  CULTURE, BLOOD (ROUTINE X 2)     Status: Normal (Preliminary result)   Collection Time   04/16/11  2:38 AM      Component Value Range Status Comment   Specimen Description BLOOD RIGHT HAND   Final    Special Requests BOTTLES DRAWN AEROBIC AND ANAEROBIC 10CC   Final    Setup Time [id number]   Final    Culture     Final    Value:  BLOOD CULTURE RECEIVED NO GROWTH TO DATE CULTURE WILL BE HELD FOR 5 DAYS BEFORE ISSUING A FINAL NEGATIVE REPORT   Report Status PENDING   Incomplete   CULTURE, BLOOD (ROUTINE X 2)     Status: Normal (Preliminary result)   Collection Time   04/16/11  2:47 AM      Component Value Range Status Comment   Specimen Description BLOOD RIGHT ANTECUBITAL   Final    Special Requests BOTTLES DRAWN AEROBIC AND ANAEROBIC 10CC   Final     Setup Time [id number]   Final    Culture     Final    Value:        BLOOD CULTURE RECEIVED NO GROWTH TO DATE CULTURE WILL BE HELD FOR 5 DAYS BEFORE ISSUING A FINAL NEGATIVE REPORT   Report Status PENDING   Incomplete     Studies/Results: Dg Chest 2 View  04/15/2011  *RADIOLOGY REPORT*  Clinical Data: Cough with shortness of breath  CHEST - 2 VIEW  Comparison: 04/06/2011.  Findings: Cardiomegaly.  Marked worsening aeration compared with priors with new areas of airspace opacity in the left mid and lower lung zones.  Findings consistent with development of acute bacterial pneumonia likely involving the left lower lobe.  Moderate hyperinflation suggesting COPD.  Calcified tortuous aorta. Osteopenia.  IMPRESSION:  Left mid and lower lung zone infiltrates as described. Cardiomegaly.  These are new from 04/06/2011.  The the  Original Report Authenticated By: Elsie Stain, M.D.   Dg Chest 2 View  04/06/2011  *RADIOLOGY REPORT*  Clinical Data: Fall.  CHEST - 2 VIEW  Comparison: PA and lateral chest 06/21/2009.  Findings: Lungs are clear.  Heart size is normal.  No pneumothorax or pleural effusion.  No focal bony abnormality.  IMPRESSION: No acute disease.  Original Report Authenticated By: Bernadene Bell. Maricela Curet, M.D.   Dg Pelvis 1-2 Views  04/06/2011  *RADIOLOGY REPORT*  Clinical Data: Fall, pain.  PELVIS - 1-2 VIEW  Comparison: None.  Findings: Hips are located.  No fracture.  There is some degenerative change about the hips.  Soft tissues unremarkable.  IMPRESSION: No acute finding.  Original Report Authenticated By: Bernadene Bell. Maricela Curet, M.D.   Ct Head Wo Contrast  04/16/2011  *RADIOLOGY REPORT*  Clinical Data: Evaluate for hemorrhage.  CT HEAD WITHOUT CONTRAST  Technique:  Contiguous axial images were obtained from the base of the skull through the vertex without contrast.  Comparison: Head CT 04/06/2011  Findings: Stable appearance of the brain.  Chronic microvascular ischemic changes in the  periventricular white matter.  Negative for hemorrhage, hydrocephalus, mass effect, mass lesion, or evidence of acute infarction.  The visualized paranasal sinuses, mastoid air cells, and middle ears are clear.  The skull is intact  IMPRESSION: No acute intracranial abnormality.  Chronic microvascular ischemic changes.  Original Report Authenticated By: Britta Mccreedy, M.D.   Ct Head Wo Contrast  04/06/2011  *RADIOLOGY REPORT*  Clinical Data:  Fall, head injury  CT HEAD WITHOUT CONTRAST CT CERVICAL SPINE WITHOUT CONTRAST  Technique:  Multidetector CT imaging of the head and cervical spine was performed following the standard protocol without intravenous contrast.  Multiplanar CT image reconstructions of the cervical spine were also generated.  Comparison:  01/13/2009  CT HEAD  Findings: Motion artifacts, which repeat imaging was performed. Mild atrophy. Normal ventricular morphology. No midline shift or mass effect. Small vessel chronic ischemic changes of deep cerebral white matter. No intracranial hemorrhage, mass lesion or evidence of acute infarction. No  definite extra-axial fluid collections. Bones appear demineralized. Visualized paranasal sinuses and mastoid air cells clear. No acute calvarial abnormality.  IMPRESSION: Atrophy with small vessel chronic ischemic changes of deep cerebral white matter. No acute intracranial abnormalities.  CT CERVICAL SPINE  Findings: Osseous demineralization. Visualized skull base intact. Lung apices clear. Prevertebral soft tissues normal thickness. Disc space narrowing endplate spur formation C6-C7. Minimal scattered facet degenerative changes. Vertebral body heights maintained without fracture or subluxation. No bone destruction.  IMPRESSION: Minimal degenerative disc and facet disease changes of the cervical spine. Osseous demineralization. No acute bony abnormalities.  Original Report Authenticated By: Lollie Marrow, M.D.   Ct Cervical Spine Wo Contrast  04/06/2011   *RADIOLOGY REPORT*  Clinical Data:  Fall, head injury  CT HEAD WITHOUT CONTRAST CT CERVICAL SPINE WITHOUT CONTRAST  Technique:  Multidetector CT imaging of the head and cervical spine was performed following the standard protocol without intravenous contrast.  Multiplanar CT image reconstructions of the cervical spine were also generated.  Comparison:  01/13/2009  CT HEAD  Findings: Motion artifacts, which repeat imaging was performed. Mild atrophy. Normal ventricular morphology. No midline shift or mass effect. Small vessel chronic ischemic changes of deep cerebral white matter. No intracranial hemorrhage, mass lesion or evidence of acute infarction. No definite extra-axial fluid collections. Bones appear demineralized. Visualized paranasal sinuses and mastoid air cells clear. No acute calvarial abnormality.  IMPRESSION: Atrophy with small vessel chronic ischemic changes of deep cerebral white matter. No acute intracranial abnormalities.  CT CERVICAL SPINE  Findings: Osseous demineralization. Visualized skull base intact. Lung apices clear. Prevertebral soft tissues normal thickness. Disc space narrowing endplate spur formation C6-C7. Minimal scattered facet degenerative changes. Vertebral body heights maintained without fracture or subluxation. No bone destruction.  IMPRESSION: Minimal degenerative disc and facet disease changes of the cervical spine. Osseous demineralization. No acute bony abnormalities.  Original Report Authenticated By: Lollie Marrow, M.D.   Dg Knee Complete 4 Views Right  04/06/2011  *RADIOLOGY REPORT*  Clinical Data: Fall, pain.  Laceration.  RIGHT KNEE - COMPLETE 4+ VIEW  Comparison: None.  Findings: No fracture, dislocation or joint effusion is identified. No radiopaque foreign body.  IMPRESSION: No acute finding.  Original Report Authenticated By: Bernadene Bell. D'ALESSIO, M.D.   Dg Humerus Right  04/06/2011  *RADIOLOGY REPORT*  Clinical Data: Fall, pain.  RIGHT HUMERUS - 2+ VIEW   Comparison: None.  Findings: No acute bony or joint abnormality is identified.  No marked degenerative change.  IMPRESSION: Negative exam.  Original Report Authenticated By: Bernadene Bell. Maricela Curet, M.D.   Medications: Scheduled Meds:   . albuterol  2.5 mg Nebulization Q6H  . azithromycin  500 mg Intravenous Q24H  . azithromycin  500 mg Oral Once  . cefTRIAXone (ROCEPHIN)  IV  1 g Intravenous Q24H  . enoxaparin  40 mg Subcutaneous Q24H  . finasteride  5 mg Oral Daily  . ipratropium  0.5 mg Nebulization Q6H  . metoprolol tartrate  12.5 mg Oral Q6H  . pantoprazole  40 mg Oral Q1200  . DISCONTD: cefTRIAXone (ROCEPHIN)  IV  1 g Intravenous Q24H   Continuous Infusions:   PRN Meds:.  Assessment/Plan: #1 Pneumonia-will continue iv rocephin and zithromax with nebulizer treatment.Patient is improved clinically #2 Atrial fib/flutter, transient-Patient now in normal rhythm. #3 Hyponatremia-sodium level is normal #4 Anemia-will monitor H&H #5 Dehydration-hydration improved #6 HTN-Bp is stable #7 Hypokalemia-corrected.K level is normal now. #8 ?Cognitive impairment.  Will repeat cxr in a.m and if no abnormality,pls d/c.  LOS: 3 days   Fumio Vandam 04/18/2011, 10:16 AM

## 2011-04-18 NOTE — Progress Notes (Signed)
Pt assisted to ambulate around hallway with a walker with supervision.

## 2011-04-19 ENCOUNTER — Inpatient Hospital Stay (HOSPITAL_COMMUNITY): Payer: Medicare Other

## 2011-04-19 DIAGNOSIS — I5033 Acute on chronic diastolic (congestive) heart failure: Secondary | ICD-10-CM

## 2011-04-19 LAB — CBC
HCT: 32.6 % — ABNORMAL LOW (ref 39.0–52.0)
Hemoglobin: 11.2 g/dL — ABNORMAL LOW (ref 13.0–17.0)
WBC: 9 10*3/uL (ref 4.0–10.5)

## 2011-04-19 LAB — COMPREHENSIVE METABOLIC PANEL
Alkaline Phosphatase: 88 U/L (ref 39–117)
BUN: 17 mg/dL (ref 6–23)
CO2: 24 mEq/L (ref 19–32)
Chloride: 100 mEq/L (ref 96–112)
GFR calc Af Amer: 89 mL/min — ABNORMAL LOW (ref >90)
Glucose, Bld: 97 mg/dL (ref 70–99)
Potassium: 3.8 mEq/L (ref 3.5–5.1)
Total Bilirubin: 0.4 mg/dL (ref 0.3–1.2)

## 2011-04-19 MED ORDER — LISINOPRIL 10 MG PO TABS
10.0000 mg | ORAL_TABLET | Freq: Every day | ORAL | Status: DC
  Administered 2011-04-19 – 2011-04-20 (×2): 10 mg via ORAL
  Filled 2011-04-19 (×2): qty 1

## 2011-04-19 MED ORDER — HYDROCHLOROTHIAZIDE 25 MG PO TABS: 12.5000 mg | ORAL_TABLET | Freq: Every day | ORAL | Status: DC

## 2011-04-19 MED ORDER — MOXIFLOXACIN HCL 400 MG PO TABS
400.0000 mg | ORAL_TABLET | Freq: Every day | ORAL | Status: DC
  Administered 2011-04-19: 400 mg via ORAL
  Filled 2011-04-19 (×2): qty 1

## 2011-04-19 MED ORDER — HYDROCHLOROTHIAZIDE 12.5 MG PO CAPS
12.5000 mg | ORAL_CAPSULE | Freq: Every day | ORAL | Status: DC
  Administered 2011-04-19 – 2011-04-20 (×2): 12.5 mg via ORAL
  Filled 2011-04-19 (×2): qty 1

## 2011-04-19 NOTE — Progress Notes (Signed)
Physical Therapy Treatment Patient Details Name: Tim Black MRN: [id number] DOB: 01-May-1924 Today's Date: 04/19/2011 1510-1537 1GT, 1NR  PT Assessment/Plan  PT - Assessment/Plan Comments on Treatment Session: Patient irritated not sent home today.  Seems to continue to demonstrate some imbalance with head turns with gait, but actually better today than on previous visit.  Needs rolling walker and HHPT at d/c.  Discussed with patient and wife. PT Plan: Discharge plan remains appropriate PT Frequency: Min 3X/week Follow Up Recommendations: Home health PT Equipment Recommended: Rolling walker with 5" wheels PT Goals  Acute Rehab PT Goals PT Goal: Sit to Stand - Progress: Progressing toward goal PT Goal: Stand to Sit - Progress: Met PT Goal: Ambulate - Progress: Progressing toward goal PT Goal: Up/Down Stairs - Progress: Progressing toward goal  PT Treatment Precautions/Restrictions  Precautions Precautions: Fall Restrictions Weight Bearing Restrictions: No Mobility (including Balance) Bed Mobility Supine to Sit: 6: Modified independent (Device/Increase time);HOB flat Transfers Sit to Stand: 5: Supervision;With upper extremity assist;From bed;From chair/3-in-1 Sit to Stand Details (indicate cue type and reason): supervision for safety Stand to Sit: 6: Modified independent (Device/Increase time);To chair/3-in-1;To bed;With upper extremity assist Ambulation/Gait Ambulation/Gait Assistance: 5: Supervision Ambulation/Gait Assistance Details (indicate cue type and reason): one episode of LOB with looking to right with min assist to recover, min cues for attention to task and forward gaze Ambulation Distance (Feet): 400 Feet Assistive device: Rolling walker Gait Pattern: Lateral trunk lean to right Stairs: Yes Stairs Assistance: 4: Min assist Stairs Assistance Details (indicate cue type and reason): min assist for safety and cue for step to technique Stair Management Technique:  One rail Left;Alternating pattern (both hands on rail for part of ascent and all descent) Number of Stairs: 6   Berg Balance Test Sit to Stand: Able to stand  independently using hands Standing Unsupported: Able to stand safely 2 minutes Sitting with Back Unsupported but Feet Supported on Floor or Stool: Able to sit safely and securely 2 minutes Stand to Sit: Sits safely with minimal use of hands Transfers: Able to transfer safely, minor use of hands Standing Unsupported with Eyes Closed: Able to stand 10 seconds safely Standing Ubsupported with Feet Together: Able to place feet together independently and stand 1 minute safely From Standing, Reach Forward with Outstretched Arm: Can reach confidently >25 cm (10") From Standing Position, Pick up Object from Floor: Able to pick up shoe, needs supervision From Standing Position, Turn to Look Behind Over each Shoulder: Turn sideways only but maintains balance Turn 360 Degrees: Able to turn 360 degrees safely but slowly Standing Unsupported, Alternately Place Feet on Step/Stool: Able to complete >2 steps/needs minimal assist Standing Unsupported, One Foot in Front: Able to plae foot ahead of the other independently and hold 30 seconds Standing on One Leg: Tries to lift leg/unable to hold 3 seconds but remains standing independently Total Score: 43 (scores less than 45 demonstrate fall risk.Marland KitchenMarland KitchenNeeds walker at all times to decrease risk.) Exercise  Other Exercises Other Exercises: continued to reinforce fall risk reduction techniques with patient and wife including lighting, foot wear, assist on steps and sit several minutes before rising.   End of Session PT - End of Session Equipment Utilized During Treatment: Gait belt Activity Tolerance: Patient tolerated treatment well Patient left: in bed;with family/visitor present General Behavior During Session: [facility] for tasks performed Cognition: [facility] for tasks performed  [facility] 04/19/2011, 5:36  PM

## 2011-04-19 NOTE — Progress Notes (Signed)
PATIENT DETAILS Name: Tim Black Age: 77 y.o. Sex: male Date of Birth: 10-23-1923 Admit Date: 04/15/2011 [id number] Hessie Diener, MD  CONSULTS:  None   Interval History: Tim Black is an 77 year old male who was admitted on 04/15/2011 with community-acquired pneumonia. He also had new onset atrial fibrillation. He has been maintained on IV antibiotics while in hospital and has shown some clinical improvement although he has not formally been evaluated by the physical therapists and, according to the patient's wife, seems weaker.  ROS: The patient continues to have an occasional cough. He denies dyspnea. Denies chest pain palpitations. He is anxious to go home. He feels that being in the hospital is making him weaker.   Objective: Vital signs in last 24 hours: Temp:  [97.3 F (36.3 C)-98.3 F (36.8 C)] 97.9 F (36.6 C) (12/05 0558) Pulse Rate:  [64-80] 75  (12/05 0558) Resp:  [18-28] 28  (12/05 0558) BP: (134-166)/(65-78) 156/77 mmHg (12/05 0558) SpO2:  [90 %-95 %] 90 % (12/05 0558) Weight change:  Last BM Date: 04/18/11  Intake/Output from previous day:  Intake/Output Summary (Last 24 hours) at 04/19/11 1117 Last data filed at 04/19/11 0945  Gross per 24 hour  Intake   1000 ml  Output   1380 ml  Net   -380 ml     Physical Exam:  Gen:  No acute distress. Cardiovascular:  Heart sounds irregularly irregular. Respiratory: Bibasilar crackles. Good air movement. Gastrointestinal: Abdomen soft, nontender, nondistended with normal active bowel sounds. Extremities: No clubbing, edema, or cyanosis.   Lab Results: Basic Metabolic Panel:  Lab 04/19/11 1914 04/18/11 0512 04/17/11 0544 04/16/11 0815 04/16/11 0411 04/15/11 1943  NA 134* 136 139 -- 137 131*  K 3.8 3.9 -- -- -- --  CL 100 104 105 -- 100 98  CO2 24 24 24  -- 27 23  GLUCOSE 97 110* 117* -- 94 102*  BUN 17 19 20  -- 32* 37*  CREATININE 0.89 0.88 0.91 -- 1.18 1.25  CALCIUM 8.8 8.4 8.3* -- 8.9 9.0  MG -- --  -- 2.0 2.1 --  PHOS -- -- -- -- 3.1 --   GFR Estimated Creatinine Clearance: 54.5 ml/min (by C-G formula based on Cr of 0.89). Liver Function Tests:  Lab 04/19/11 0455 04/18/11 0512 04/17/11 0544 04/16/11 0411  AST 38* 38* 53* 36  ALT 35 36 41 30  ALKPHOS 88 88 90 97  BILITOT 0.4 0.4 0.3 0.4  PROT 6.1 6.0 6.1 6.7  ALBUMIN 2.1* 2.1* 2.3* 2.6*   Coagulation profile  Lab 04/16/11 0411 04/15/11 1943  INR 1.08 1.04  PROTIME -- --    CBC:  Lab 04/19/11 0455 04/18/11 0512 04/17/11 0544 04/16/11 0411 04/15/11 1943  WBC 9.0 8.5 8.4 9.3 10.7*  NEUTROABS -- 6.5 6.5 7.4 8.9*  HGB 11.2* 10.9* 10.5* 11.8* 12.1*  HCT 32.6* 31.9* 30.8* 34.8* 35.3*  MCV 92.1 93.3 92.5 95.1 94.6  PLT 285 249 211 225 222   Cardiac Enzymes:  Lab 04/16/11 1340 04/16/11 0830 04/16/11 0247 04/15/11 1943  CKTOTAL 128 140 142 --  CKMB 2.9 3.1 3.4 --  CKMBINDEX -- -- -- --  TROPONINI <0.30 <0.30 <0.30 <0.30   BNP:  Lab 04/16/11 0411 04/15/11 1929  POCBNP 1906.0* 1594.0*     Recent Results (from the past 240 hour(s))  CULTURE, BLOOD (ROUTINE X 2)     Status: Normal (Preliminary result)   Collection Time   04/16/11  2:38 AM      Component Value Range Status  Comment   Specimen Description BLOOD RIGHT HAND   Final    Special Requests BOTTLES DRAWN AEROBIC AND ANAEROBIC 10CC   Final    Setup Time [id number]   Final    Culture     Final    Value:        BLOOD CULTURE RECEIVED NO GROWTH TO DATE CULTURE WILL BE HELD FOR 5 DAYS BEFORE ISSUING A FINAL NEGATIVE REPORT   Report Status PENDING   Incomplete   CULTURE, BLOOD (ROUTINE X 2)     Status: Normal (Preliminary result)   Collection Time   04/16/11  2:47 AM      Component Value Range Status Comment   Specimen Description BLOOD RIGHT ANTECUBITAL   Final    Special Requests BOTTLES DRAWN AEROBIC AND ANAEROBIC 10CC   Final    Setup Time [id number]   Final    Culture     Final    Value:        BLOOD CULTURE RECEIVED NO GROWTH TO DATE CULTURE WILL BE  HELD FOR 5 DAYS BEFORE ISSUING A FINAL NEGATIVE REPORT   Report Status PENDING   Incomplete     Studies/Results: Dg Chest 1 View  04/19/2011  *RADIOLOGY REPORT*  Clinical Data: Pneumonia.  CHEST - 1 VIEW  Comparison: 04/15/2011.  Findings: Consolidation peripheral aspect left mid lung remains. This may represent an infectious infiltrate.  Underlying mass felt to be less likely consideration given the change from the 04/06/2011 exam.  Recommend follow-up until clearance.  Interval development of the right mid lung/perihilar parenchymal changes which may represent atelectasis although subtle infiltrate not excluded.  Left base subsegmental atelectasis.  Cardiomegaly pulmonary vascular congestion most notable centrally. No gross pneumothorax.  IMPRESSION: Peripheral left mid lung infiltrate appears the same or slightly worse.  Right perihilar/right mid lung new opacity may represent atelectasis although infiltrate not entirely excluded.  Left base subsegmental atelectasis.  Cardiomegaly with central pulmonary vascular prominence.  Original Report Authenticated By: Fuller Canada, M.D.    Medications: Scheduled Meds:   . albuterol  2.5 mg Nebulization BID  . azithromycin  500 mg Intravenous Q24H  . cefTRIAXone (ROCEPHIN)  IV  1 g Intravenous Q24H  . enoxaparin  40 mg Subcutaneous Q24H  . finasteride  5 mg Oral Daily  . ipratropium  0.5 mg Nebulization BID  . metoprolol tartrate  12.5 mg Oral Q6H  . pantoprazole  40 mg Oral Q1200  . potassium chloride  10 mEq Oral BID   Continuous Infusions:  PRN Meds:.albuterol Antibiotics: Anti-infectives     Start     Dose/Rate Route Frequency Ordered Stop   04/16/11 2200   cefTRIAXone (ROCEPHIN) 1 g in dextrose 5 % 50 mL IVPB        1 g 100 mL/hr over 30 Minutes Intravenous Every 24 hours 04/16/11 0121 04/23/11 2159   04/16/11 2200   azithromycin (ZITHROMAX) 500 mg in dextrose 5 % 250 mL IVPB        500 mg 250 mL/hr over 60 Minutes Intravenous Every  24 hours 04/16/11 0121 04/23/11 2159   04/15/11 2200   cefTRIAXone (ROCEPHIN) 1 g in dextrose 5 % 50 mL IVPB  Status:  Discontinued        1 g 100 mL/hr over 30 Minutes Intravenous Every 24 hours 04/15/11 2155 04/16/11 0130   04/15/11 2200   azithromycin (ZITHROMAX) tablet 500 mg        500 mg Oral  Once 04/15/11 2155 04/15/11  2205           Assessment/Plan:  Principal Problem:  *Pneumonia Assessment: Improved clinically. No change in radiographic appearance. Plan: Discontinue IV antibiotics. Start by mouth Avelox in anticipation of discharge home in the next 24-48 hours. Active Problems:  Atrial fib/flutter, transient Assessment: Likely related to pneumonia. Plan: High fall risk precludes treatment with Coumadin. He actually had a fall prior to admission requiring stitches in his head. Would discharge him on aspirin therapy alone.  Hyponatremia Assessment: Maybe secondary to CHF physiology. Plan: Resume hydrochlorothiazide today.  Anemia Assessment: Mild, normocytic. Plan: Outpatient followup with PCP.  Dehydration Assessment: Resolved. Plan: IV fluids have been discontinued.  CKD (chronic kidney disease) stage 3, GFR 30-59 ml/min Assessment: Baseline creatinine is approximately  0.94-1.2. Renal function stable. Plan: Continue to monitor.  Diastolic CHF, acute on chronic Assessment: Elevated proBNP, crackles on exam, status post two-dimensional echocardiogram on 04/17/2011 showing grade 1 diastolic dysfunction. Plan: Resume lisinopril/HCTZ today.  Disposition We'll obtain PT/OT evaluation today and if stable later on this afternoon, consider discharge home.   LOS: 4 days   Hillery Aldo, MD Pager [phone]  04/19/2011, 11:17 AM

## 2011-04-20 LAB — CBC
Platelets: 345 10*3/uL (ref 150–400)
RDW: 12.6 % (ref 11.5–15.5)
WBC: 10.9 10*3/uL — ABNORMAL HIGH (ref 4.0–10.5)

## 2011-04-20 LAB — BASIC METABOLIC PANEL
Chloride: 100 mEq/L (ref 96–112)
Creatinine, Ser: 1.03 mg/dL (ref 0.50–1.35)
GFR calc Af Amer: 75 mL/min — ABNORMAL LOW (ref >90)
Potassium: 4 mEq/L (ref 3.5–5.1)

## 2011-04-20 MED ORDER — POTASSIUM CHLORIDE CRYS ER 10 MEQ PO TBCR: 10.0000 meq | EXTENDED_RELEASE_TABLET | Freq: Every day | ORAL | Status: DC

## 2011-04-20 MED ORDER — MOXIFLOXACIN HCL 400 MG PO TABS: 400.0000 mg | ORAL_TABLET | Freq: Every day | ORAL | Status: AC

## 2011-04-20 MED ORDER — METOPROLOL SUCCINATE ER 50 MG PO TB24: 50.0000 mg | ORAL_TABLET | Freq: Every day | ORAL | Status: DC

## 2011-04-20 NOTE — Discharge Summary (Signed)
Physician Discharge Summary  Patient ID: Tim Black MRN: [id number] DOB/AGE: February 02, 1924 77 y.o.  Admit date: 04/15/2011 Discharge date: 04/20/2011  Primary Care Physician:  [name], MD   Discharge Diagnoses:    Present on Admission:  .Pneumonia .Atrial fib/flutter, transient .Hyponatremia .Anemia .Dehydration .CKD (chronic kidney disease) stage 3, GFR 30-59 ml/min  Discharge Medications:  Current Discharge Medication List    START taking these medications   Details  metoprolol (TOPROL XL) 50 MG 24 hr tablet Take 1 tablet (50 mg total) by mouth daily. Qty: 30 tablet, Refills: 2    moxifloxacin (AVELOX) 400 MG tablet Take 1 tablet (400 mg total) by mouth daily at 6 PM. Qty: 5 tablet, Refills: 0    potassium chloride (K-DUR,KLOR-CON) 10 MEQ tablet Take 1 tablet (10 mEq total) by mouth daily. Qty: 30 tablet, Refills: 1      CONTINUE these medications which have NOT CHANGED   Details  finasteride (PROSCAR) 5 MG tablet Take 5 mg by mouth daily.      lisinopril-hydrochlorothiazide (PRINZIDE,ZESTORETIC) 10-12.5 MG per tablet Take 1 tablet by mouth daily.      Multiple Vitamin (MULTIVITAMIN) tablet Take 1 tablet by mouth daily.      omeprazole (PRILOSEC) 20 MG capsule Take 20 mg by mouth daily.      aspirin 81 MG EC tablet Take 81 mg by mouth every other day. Swallow whole.         Disposition and Follow-up: The patient is being discharged home with home health nursing services including PT, OT, and an Charity fundraiser. He is instructed to followup with his primary care physician in one week.  Consults: None  Significant Diagnostic Studies:  Dg Chest 1 View  04/19/2011  *RADIOLOGY REPORT*  Clinical Data: Pneumonia.  CHEST - 1 VIEW  Comparison: 04/15/2011.  Findings: Consolidation peripheral aspect left mid lung remains. This may represent an infectious infiltrate.  Underlying mass felt to be less likely consideration given the change from the 04/06/2011 exam.   Recommend follow-up until clearance.  Interval development of the right mid lung/perihilar parenchymal changes which may represent atelectasis although subtle infiltrate not excluded.  Left base subsegmental atelectasis.  Cardiomegaly pulmonary vascular congestion most notable centrally. No gross pneumothorax.  IMPRESSION: Peripheral left mid lung infiltrate appears the same or slightly worse.  Right perihilar/right mid lung new opacity may represent atelectasis although infiltrate not entirely excluded.  Left base subsegmental atelectasis.  Cardiomegaly with central pulmonary vascular prominence.  Original Report Authenticated By: Fuller Canada, M.D.   Dg Chest 2 View  04/15/2011  *RADIOLOGY REPORT*  Clinical Data: Cough with shortness of breath  CHEST - 2 VIEW  Comparison: 04/06/2011.  Findings: Cardiomegaly.  Marked worsening aeration compared with priors with new areas of airspace opacity in the left mid and lower lung zones.  Findings consistent with development of acute bacterial pneumonia likely involving the left lower lobe.  Moderate hyperinflation suggesting COPD.  Calcified tortuous aorta. Osteopenia.  IMPRESSION:  Left mid and lower lung zone infiltrates as described. Cardiomegaly.  These are new from 04/06/2011.  The the  Original Report Authenticated By: Elsie Stain, M.D.   Ct Head Wo Contrast  04/16/2011  *RADIOLOGY REPORT*  Clinical Data: Evaluate for hemorrhage.  CT HEAD WITHOUT CONTRAST  Technique:  Contiguous axial images were obtained from the base of the skull through the vertex without contrast.  Comparison: Head CT 04/06/2011  Findings: Stable appearance of the brain.  Chronic microvascular ischemic changes in the periventricular white  matter.  Negative for hemorrhage, hydrocephalus, mass effect, mass lesion, or evidence of acute infarction.  The visualized paranasal sinuses, mastoid air cells, and middle ears are clear.  The skull is intact  IMPRESSION: No acute intracranial  abnormality.  Chronic microvascular ischemic changes.  Original Report Authenticated By: Britta Mccreedy, M.D.   Two-dimensional echocardiogram 04/17/2011 Study Conclusions  - Left ventricle: The cavity size was normal. There was mild focal basal hypertrophy of the septum. Systolic function was vigorous. The estimated ejection fraction was in the range of 65% to 70%. Wall motion was normal; there were no regional wall motion abnormalities. Doppler parameters are consistent with abnormal left ventricular relaxation (grade 1 diastolic dysfunction). - Aortic valve: Trivial regurgitation. - Mitral valve: Mild to moderate regurgitation. - Pulmonary arteries: Systolic pressure was mildly increased. PA peak pressure: 37mm Hg (S).     Discharge Laboratory Values: Basic Metabolic Panel:  Lab 04/20/11 1610 04/19/11 0455 04/18/11 0512 04/17/11 0544 04/16/11 0815 04/16/11 0411  NA 134* 134* 136 139 -- 137  K 4.0 3.8 -- -- -- --  CL 100 100 104 105 -- 100  CO2 26 24 24 24  -- 27  GLUCOSE 99 97 110* 117* -- 94  BUN 20 17 19 20  -- 32*  CREATININE 1.03 0.89 0.88 0.91 -- 1.18  CALCIUM 8.9 8.8 8.4 8.3* -- 8.9  MG -- -- -- -- 2.0 2.1  PHOS -- -- -- -- -- 3.1   GFR Estimated Creatinine Clearance: 46.1 ml/min (by C-G formula based on Cr of 1.03). Liver Function Tests:  Lab 04/19/11 0455 04/18/11 0512 04/17/11 0544 04/16/11 0411  AST 38* 38* 53* 36  ALT 35 36 41 30  ALKPHOS 88 88 90 97  BILITOT 0.4 0.4 0.3 0.4  PROT 6.1 6.0 6.1 6.7  ALBUMIN 2.1* 2.1* 2.3* 2.6*   Coagulation profile  Lab 04/16/11 0411 04/15/11 1943  INR 1.08 1.04  PROTIME -- --    CBC:  Lab 04/20/11 0410 04/19/11 0455 04/18/11 0512 04/17/11 0544 04/16/11 0411 04/15/11 1943  WBC 10.9* 9.0 8.5 8.4 9.3 --  NEUTROABS -- -- 6.5 6.5 7.4 8.9*  HGB 12.0* 11.2* 10.9* 10.5* 11.8* --  HCT 35.6* 32.6* 31.9* 30.8* 34.8* --  MCV 92.0 92.1 93.3 92.5 95.1 --  PLT 345 285 249 211 225 --   Cardiac Enzymes:  Lab 04/16/11 1340  04/16/11 0830 04/16/11 0247 04/15/11 1943  CKTOTAL 128 140 142 --  CKMB 2.9 3.1 3.4 --  CKMBINDEX -- -- -- --  TROPONINI <0.30 <0.30 <0.30 <0.30   BNP:  Lab 04/16/11 0411 04/15/11 1929  POCBNP 1906.0* 1594.0*     Brief H and P: For complete details please refer to admission H and P, but in brief, Mr. Klemp is an 77 year old male who was admitted on 04/15/2011 with community-acquired pneumonia. He also had new onset atrial fibrillation.   Physical Exam at Discharge: BP 121/77  Pulse 82  Temp(Src) 98.1 F (36.7 C) (Oral)  Resp 19  Ht 5\' 7"  (1.702 m)  Wt 61.1 kg (134 lb 11.2 oz)  BMI 21.10 kg/m2  SpO2 92%  Gen: No acute distress.  Cardiovascular: Heart sounds irregularly irregular.  Respiratory: Bibasilar crackles. Good air movement.  Gastrointestinal: Abdomen soft, nontender, nondistended with normal active bowel sounds.  Extremities: No clubbing, edema, or cyanosis.     Hospital Course:  Principal Problem:  *Pneumonia The patient was admitted and put on IV antibiotics with Rocephin and azithromycin. His antibiotics were changed to by mouth  Avelox on 04/19/2011. He is currently on day #5 of antibiotics and we will discharge him on an additional 5 days of by mouth Avelox for a total treatment course of 10 days. He has improved clinically over the course of his hospital stay. Active Problems:  Atrial fib/flutter, transient The patient has been monitored on telemetry and has been persistently in atrial fibrillation. I suspect this is due to his underlying pneumonia. He is not felt to be a good Coumadin candidate given his advanced age and multiple comorbidities and high risk for falls. At this point, would discharge him on aspirin therapy and metoprolol for rate control. Recommend followup with his primary care physician and would defer the ultimate decision as to whether or not the patient would benefit from Coumadin to him.  Hyponatremia Mild and likely related to CHF  physiology. Patient's hydrochlorothiazide was resumed on 04/19/2011.  Anemia Patient has a mild chronic normocytic anemia which is likely secondary to chronic disease. He can followup with his primary care physician regarding any further evaluation is necessary.  Dehydration Resolved with IV fluids.  CKD (chronic kidney disease) stage 3, GFR 30-59 ml/min The patient's baseline creatinine is anywhere from 0.94-1.2. His renal function has remained stable throughout his hospital stay.  Diastolic CHF, acute on chronic Patient does have a history of grade 1 diastolic dysfunction based on an echocardiogram done on 04/27/2011. His pro BNP was elevated and he had some crackles noted on physical exam. He is clinically, well compensated. The patient's lisinopril/HCTZ was resumed on 04/19/2011.  Recommendations for hospital follow-up: 1.  Consideration of Coumadin therapy versus aspirin for atrial fibrillation of new onset. 2.  outpatient anemia evaluation if deemed appropriate.  Time spent on Discharge: 50 minutes.  Signed: Dr. Trula Ore Rama Pager [phone] 04/20/2011, 2:30 PM

## 2011-04-22 LAB — CULTURE, BLOOD (ROUTINE X 2)
Culture  Setup Time: 201212021104
Culture: NO GROWTH

## 2011-05-31 ENCOUNTER — Other Ambulatory Visit (HOSPITAL_COMMUNITY): Payer: Self-pay | Admitting: Internal Medicine

## 2011-12-21 ENCOUNTER — Ambulatory Visit: Payer: Medicare Other | Attending: Family Medicine | Admitting: Physical Therapy

## 2011-12-21 DIAGNOSIS — R269 Unspecified abnormalities of gait and mobility: Secondary | ICD-10-CM | POA: Insufficient documentation

## 2011-12-21 DIAGNOSIS — IMO0001 Reserved for inherently not codable concepts without codable children: Secondary | ICD-10-CM | POA: Insufficient documentation

## 2011-12-21 DIAGNOSIS — Z9181 History of falling: Secondary | ICD-10-CM | POA: Insufficient documentation

## 2012-01-08 ENCOUNTER — Other Ambulatory Visit: Payer: Self-pay | Admitting: Diagnostic Neuroimaging

## 2012-01-08 DIAGNOSIS — G20 Parkinson's disease: Secondary | ICD-10-CM

## 2012-01-08 DIAGNOSIS — R292 Abnormal reflex: Secondary | ICD-10-CM

## 2012-01-08 DIAGNOSIS — R269 Unspecified abnormalities of gait and mobility: Secondary | ICD-10-CM

## 2012-01-09 ENCOUNTER — Ambulatory Visit: Payer: Medicare Other | Admitting: Physical Therapy

## 2012-01-11 ENCOUNTER — Ambulatory Visit: Payer: Medicare Other | Admitting: Physical Therapy

## 2012-01-16 ENCOUNTER — Ambulatory Visit: Payer: Medicare Other | Attending: Family Medicine | Admitting: Physical Therapy

## 2012-01-16 DIAGNOSIS — IMO0001 Reserved for inherently not codable concepts without codable children: Secondary | ICD-10-CM | POA: Insufficient documentation

## 2012-01-16 DIAGNOSIS — Z9181 History of falling: Secondary | ICD-10-CM | POA: Insufficient documentation

## 2012-01-16 DIAGNOSIS — R269 Unspecified abnormalities of gait and mobility: Secondary | ICD-10-CM | POA: Insufficient documentation

## 2012-01-18 ENCOUNTER — Ambulatory Visit: Payer: Medicare Other | Admitting: Physical Therapy

## 2012-01-23 ENCOUNTER — Ambulatory Visit: Payer: Medicare Other | Admitting: Physical Therapy

## 2012-01-25 ENCOUNTER — Ambulatory Visit: Payer: Medicare Other | Admitting: Physical Therapy

## 2012-01-30 ENCOUNTER — Ambulatory Visit: Payer: Medicare Other | Admitting: Physical Therapy

## 2012-02-01 ENCOUNTER — Ambulatory Visit: Payer: Medicare Other | Admitting: Physical Therapy

## 2012-02-15 ENCOUNTER — Ambulatory Visit
Admission: RE | Admit: 2012-02-15 | Discharge: 2012-02-15 | Disposition: A | Discharge status: Home / Self Care | Payer: Medicare Other | Source: Ambulatory Visit | Attending: Diagnostic Neuroimaging | Admitting: Diagnostic Neuroimaging

## 2012-02-15 DIAGNOSIS — R292 Abnormal reflex: Secondary | ICD-10-CM

## 2012-02-15 DIAGNOSIS — R269 Unspecified abnormalities of gait and mobility: Secondary | ICD-10-CM

## 2012-02-15 DIAGNOSIS — G20 Parkinson's disease: Secondary | ICD-10-CM

## 2012-02-15 DIAGNOSIS — G20C Parkinsonism, unspecified: Secondary | ICD-10-CM

## 2012-03-15 DIAGNOSIS — W108XXA Fall (on) (from) other stairs and steps, initial encounter: Secondary | ICD-10-CM

## 2012-03-15 HISTORY — DX: Fall (on) (from) other stairs and steps, initial encounter: W10.8XXA

## 2012-07-24 ENCOUNTER — Emergency Department (HOSPITAL_COMMUNITY): Payer: Medicare Other

## 2012-07-24 ENCOUNTER — Encounter (HOSPITAL_COMMUNITY): Payer: Self-pay | Admitting: *Deleted

## 2012-07-24 ENCOUNTER — Inpatient Hospital Stay (HOSPITAL_COMMUNITY)
Admission: EM | Admit: 2012-07-24 | Discharge: 2012-07-29 | DRG: 194 | Disposition: A | Discharge status: Skilled Nursing Facility | Payer: Medicare Other | Attending: Internal Medicine | Admitting: Internal Medicine

## 2012-07-24 DIAGNOSIS — R531 Weakness: Secondary | ICD-10-CM | POA: Diagnosis present

## 2012-07-24 DIAGNOSIS — D649 Anemia, unspecified: Secondary | ICD-10-CM | POA: Diagnosis present

## 2012-07-24 DIAGNOSIS — R5381 Other malaise: Secondary | ICD-10-CM | POA: Diagnosis present

## 2012-07-24 DIAGNOSIS — I10 Essential (primary) hypertension: Secondary | ICD-10-CM | POA: Diagnosis present

## 2012-07-24 DIAGNOSIS — K219 Gastro-esophageal reflux disease without esophagitis: Secondary | ICD-10-CM | POA: Diagnosis present

## 2012-07-24 DIAGNOSIS — Z79899 Other long term (current) drug therapy: Secondary | ICD-10-CM

## 2012-07-24 DIAGNOSIS — R222 Localized swelling, mass and lump, trunk: Secondary | ICD-10-CM | POA: Diagnosis present

## 2012-07-24 DIAGNOSIS — S82899A Other fracture of unspecified lower leg, initial encounter for closed fracture: Secondary | ICD-10-CM

## 2012-07-24 DIAGNOSIS — N400 Enlarged prostate without lower urinary tract symptoms: Secondary | ICD-10-CM | POA: Diagnosis present

## 2012-07-24 DIAGNOSIS — IMO0001 Reserved for inherently not codable concepts without codable children: Secondary | ICD-10-CM

## 2012-07-24 DIAGNOSIS — Z8249 Family history of ischemic heart disease and other diseases of the circulatory system: Secondary | ICD-10-CM

## 2012-07-24 DIAGNOSIS — G20A1 Parkinson's disease without dyskinesia, without mention of fluctuations: Secondary | ICD-10-CM | POA: Diagnosis present

## 2012-07-24 DIAGNOSIS — I2510 Atherosclerotic heart disease of native coronary artery without angina pectoris: Secondary | ICD-10-CM | POA: Diagnosis present

## 2012-07-24 DIAGNOSIS — E871 Hypo-osmolality and hyponatremia: Secondary | ICD-10-CM | POA: Diagnosis present

## 2012-07-24 DIAGNOSIS — E119 Type 2 diabetes mellitus without complications: Secondary | ICD-10-CM | POA: Diagnosis present

## 2012-07-24 DIAGNOSIS — Y92009 Unspecified place in unspecified non-institutional (private) residence as the place of occurrence of the external cause: Secondary | ICD-10-CM

## 2012-07-24 DIAGNOSIS — Z9181 History of falling: Secondary | ICD-10-CM

## 2012-07-24 DIAGNOSIS — Z87891 Personal history of nicotine dependence: Secondary | ICD-10-CM

## 2012-07-24 DIAGNOSIS — J189 Pneumonia, unspecified organism: Principal | ICD-10-CM | POA: Diagnosis present

## 2012-07-24 DIAGNOSIS — Z7982 Long term (current) use of aspirin: Secondary | ICD-10-CM

## 2012-07-24 DIAGNOSIS — G20 Parkinson's disease: Secondary | ICD-10-CM | POA: Diagnosis present

## 2012-07-24 HISTORY — DX: Parkinson's disease without dyskinesia, without mention of fluctuations: G20.A1

## 2012-07-24 HISTORY — DX: Parkinson's disease: G20

## 2012-07-24 LAB — COMPREHENSIVE METABOLIC PANEL
ALT: 12 U/L (ref 0–53)
AST: 17 U/L (ref 0–37)
Albumin: 2.7 g/dL — ABNORMAL LOW (ref 3.5–5.2)
Alkaline Phosphatase: 122 U/L — ABNORMAL HIGH (ref 39–117)
BUN: 28 mg/dL — ABNORMAL HIGH (ref 6–23)
CO2: 22 mEq/L (ref 19–32)
Calcium: 8.2 mg/dL — ABNORMAL LOW (ref 8.4–10.5)
Chloride: 96 mEq/L (ref 96–112)
Creatinine, Ser: 1.06 mg/dL (ref 0.50–1.35)
GFR calc Af Amer: 71 mL/min — ABNORMAL LOW (ref >90)
GFR calc non Af Amer: 61 mL/min — ABNORMAL LOW (ref >90)
Glucose, Bld: 126 mg/dL — ABNORMAL HIGH (ref 70–99)
Potassium: 4 mEq/L (ref 3.5–5.1)
Sodium: 129 mEq/L — ABNORMAL LOW (ref 135–145)
Total Bilirubin: 0.4 mg/dL (ref 0.3–1.2)
Total Protein: 6.8 g/dL (ref 6.0–8.3)

## 2012-07-24 LAB — URINALYSIS, ROUTINE W REFLEX MICROSCOPIC
Bilirubin Urine: NEGATIVE
Glucose, UA: NEGATIVE mg/dL
Ketones, ur: NEGATIVE mg/dL
Leukocytes, UA: NEGATIVE
Nitrite: NEGATIVE
Protein, ur: NEGATIVE mg/dL
Specific Gravity, Urine: 1.034 — ABNORMAL HIGH (ref 1.005–1.030)
Urobilinogen, UA: 0.2 mg/dL (ref 0.0–1.0)
pH: 6 (ref 5.0–8.0)

## 2012-07-24 LAB — CBC WITH DIFFERENTIAL/PLATELET
Basophils Absolute: 0 10*3/uL (ref 0.0–0.1)
Basophils Relative: 0 % (ref 0–1)
Eosinophils Absolute: 0 10*3/uL (ref 0.0–0.7)
Eosinophils Relative: 0 % (ref 0–5)
HCT: 34.3 % — ABNORMAL LOW (ref 39.0–52.0)
Hemoglobin: 11.7 g/dL — ABNORMAL LOW (ref 13.0–17.0)
Lymphocytes Relative: 2 % — ABNORMAL LOW (ref 12–46)
Lymphs Abs: 0.5 10*3/uL — ABNORMAL LOW (ref 0.7–4.0)
MCH: 31.9 pg (ref 26.0–34.0)
MCHC: 34.1 g/dL (ref 30.0–36.0)
MCV: 93.5 fL (ref 78.0–100.0)
Monocytes Absolute: 1.6 10*3/uL — ABNORMAL HIGH (ref 0.1–1.0)
Monocytes Relative: 7 % (ref 3–12)
Neutro Abs: 20.4 10*3/uL — ABNORMAL HIGH (ref 1.7–7.7)
Neutrophils Relative %: 91 % — ABNORMAL HIGH (ref 43–77)
Platelets: 258 10*3/uL (ref 150–400)
RBC: 3.67 MIL/uL — ABNORMAL LOW (ref 4.22–5.81)
RDW: 12.4 % (ref 11.5–15.5)
WBC: 22.6 10*3/uL — ABNORMAL HIGH (ref 4.0–10.5)

## 2012-07-24 LAB — URINE MICROSCOPIC-ADD ON

## 2012-07-24 MED ORDER — SODIUM CHLORIDE 0.9 % IV BOLUS (SEPSIS)
1000.0000 mL | Freq: Once | INTRAVENOUS | Status: AC
  Administered 2012-07-24: 1000 mL via INTRAVENOUS

## 2012-07-24 MED ORDER — DEXTROSE 5 % IV SOLN
1.0000 g | Freq: Once | INTRAVENOUS | Status: AC
  Administered 2012-07-24: 1 g via INTRAVENOUS
  Filled 2012-07-24: qty 10

## 2012-07-24 MED ORDER — AZITHROMYCIN 500 MG IV SOLR
500.0000 mg | Freq: Once | INTRAVENOUS | Status: AC
  Administered 2012-07-24: 500 mg via INTRAVENOUS
  Filled 2012-07-24: qty 500

## 2012-07-24 MED ORDER — IOHEXOL 350 MG/ML SOLN
100.0000 mL | Freq: Once | INTRAVENOUS | Status: AC | PRN
  Administered 2012-07-24: 100 mL via INTRAVENOUS

## 2012-07-24 NOTE — ED Notes (Signed)
Bedside report received from previous RN 

## 2012-07-24 NOTE — H&P (Signed)
History and Physical  Tim Black ZOX:[id number] DOB: [birth year] DOA: 07/24/2012  Referring physician: Raeford Razor, MD PCP: [name], MD   Chief Complaint: Generalized weakness  HPI:  79 year old man presented with history of generalized weakness, decreased appetite. CT scan chest revealed multifocal pneumonia, labs revealed an acidosis.  History obtained from patient. Wife not present and not available by phone. The patient broke his ankle approximately 4 weeks ago, since that time he has had more difficulty ambulating and gaining around. He has Parkinson's disease and his wife is his caretaker. Since his ankle fracture his wife has had difficulty mobilizing him. He has had a subsequent fall in the intervening time between his ankle fracture and presentation to the hospital. He had a power outage at home the last several days and power just came back on 3/12, therefore he has been in a cold house. He reports shortness of breath when laying down and increased generalized weakness. He denies cough, fever or other complaints. He was reluctant to come to the hospital.  Per chart review EMS was called for "stroke like symptoms", EMS did not note any stroke like symptoms. Indeed patient denies any stroke like symptoms. There is no history to suggest stroke. Complaint has been generalized rather than focal weakness.  In ED  Temp 100, VSS  Sodium 129, glucose 126, WBC 22.6  CT chest--multifocal pneumonia.   Chart Review:  No EDP note available for review at time of chart completion  Review of Systems:  Negative for fever, visual changes, sore throat, rash, new muscle aches, chest pain, cough, dysuria, bleeding, n/v/abdominal pain, paresthesias, focal muscle weakness, difficulty speaking, difficulty swallowing.  Positive for poor appetite, shortness of breath when lying down.  Past Medical History  Diagnosis Date  . Hypertension   . GERD (gastroesophageal reflux disease)   .  BPH (benign prostatic hyperplasia)   . Diabetes mellitus     diet controlled, mild  . Fall down stairs 03/2012    Past Surgical History  Procedure Laterality Date  . Hernia repair    . Cataract extraction  2010    both eyes    Social History:  reports that he quit smoking about 20 years ago. His smoking use included Cigarettes and Cigars. He has a 10 pack-year smoking history. He has never used smokeless tobacco. He reports that he does not drink alcohol or use illicit drugs.  No Known Allergies  Family History  Problem Relation Age of Onset  . Coronary artery disease Father 22    Died from MI  . Coronary artery disease Brother 71    ddied form massive MI  . Brain cancer Brother      Prior to Admission medications   Medication Sig Start Date End Date Taking? Authorizing Jyll Tomaro  aspirin 81 MG EC tablet Take 81 mg by mouth every other day. Swallow whole.   Yes Historical Kawena Lyday, MD  diphenhydrAMINE (BENADRYL) 25 MG tablet Take 50 mg by mouth at bedtime.   Yes Historical Cass Edinger, MD  finasteride (PROSCAR) 5 MG tablet Take 5 mg by mouth daily with lunch.    Yes Historical Kelan Pritt, MD  fish oil-omega-3 fatty acids 1000 MG capsule Take 4 g by mouth daily with lunch.   Yes Historical Monserath Neff, MD  lisinopril-hydrochlorothiazide (PRINZIDE,ZESTORETIC) 10-12.5 MG per tablet Take 1 tablet by mouth daily with lunch.    Yes Historical Sagan Maselli, MD  metoprolol succinate (TOPROL-XL) 50 MG 24 hr tablet Take 25 mg by mouth daily with lunch. Take  with or immediately following a meal.   Yes Historical Honesti Seaberg, MD  Multiple Vitamin (MULTIVITAMIN WITH MINERALS) TABS Take 1 tablet by mouth daily with lunch.   Yes Historical Tharon Kitch, MD  omeprazole (PRILOSEC) 20 MG capsule Take 20 mg by mouth daily with lunch.    Yes Historical Mardelle Pandolfi, MD  potassium chloride (K-DUR,KLOR-CON) 10 MEQ tablet Take 10 mEq by mouth 4 (four) times daily. 04/20/11 04/19/13 Yes Maryruth Bun Rama, MD   Physical  Exam: Filed Vitals:   07/24/12 1645 07/24/12 1712 07/24/12 2100  BP:  117/57 112/55  Pulse:  97 76  Temp:  100 F (37.8 C)   TempSrc:  Oral   Resp:  19 20  SpO2: 95% 93% 93%    General:  Examined in the emergency department. Appears calm and comfortable. Nontoxic. Eyes: PERRL, normal lids. Wears glasses. ENT: grossly normal hearing, lips  Neck: no LAD, masses or thyromegaly Cardiovascular: RRR, no m/r/g. No LE edema. Respiratory: CTA bilaterally anteriorly, left-sided posterior crackles on inspiration, no w/r/r. Normal respiratory effort. Abdomen: soft, ntnd Skin: no rash or induration seen  Musculoskeletal: grossly normal tone and strength BUE/BLE. Cam walker boot right lower extremity. Psychiatric: grossly normal mood and affect, speech fluent and appropriate, slow. Neurologic: grossly non-focal. Masklike facies.  Wt Readings from Last 3 Encounters:  04/20/11 61.1 kg (134 lb 11.2 oz)    Labs on Admission:  Basic Metabolic Panel:  Recent Labs Lab 07/24/12 1810  NA 129*  K 4.0  CL 96  CO2 22  GLUCOSE 126*  BUN 28*  CREATININE 1.06  CALCIUM 8.2*    Liver Function Tests:  Recent Labs Lab 07/24/12 1810  AST 17  ALT 12  ALKPHOS 122*  BILITOT 0.4  PROT 6.8  ALBUMIN 2.7*   CBC:  Recent Labs Lab 07/24/12 1810  WBC 22.6*  NEUTROABS 20.4*  HGB 11.7*  HCT 34.3*  MCV 93.5  PLT 258    Radiological Exams on Admission: Dg Chest 2 View  07/24/2012  *RADIOLOGY REPORT*  Clinical Data: Fever, cough, weakness, history diabetes, hypertension  CHEST - 2 VIEW  Comparison: 06/01/2011  Findings: Upper normal heart size. Calcified tortuous aorta. Normal mediastinal contours and pulmonary vascularity otherwise seen. Minimal basilar atelectasis. Lungs otherwise clear. No pleural effusion or pneumothorax. Bones demineralized.  IMPRESSION: Minimal basilar atelectasis.   Original Report Authenticated By: Ulyses Southward, M.D.    Ct Angio Chest W/cm &/or Wo Cm  07/24/2012   *RADIOLOGY REPORT*  Clinical Data: Short of breath, cough  CT ANGIOGRAPHY CHEST  Technique:  Multidetector CT imaging of the chest using the standard protocol during bolus administration of intravenous contrast. Multiplanar reconstructed images including MIPs were obtained and reviewed to evaluate the vascular anatomy.  Contrast: OMNIPAQUE IOHEXOL 350 MG/ML SOLN  Comparison: Chest x-ray earlier today 07/24/2012 at 17 44 p.m.; CT abdomen/pelvis 06/06/2010  Findings:  Mediastinum: Unremarkable CT appearance of the thyroid gland.  No suspicious mediastinal adenopathy.  Prominent right hilar lymphoid tissue measures up to 1.7 cm.  No soft tissue mediastinal mass. The thoracic esophagus is unremarkable.  Heart/Vascular: Excellent opacification of the pulmonary arteries to the proximal subsegmental level. No central filling defect to suggest acute pulmonary embolus.  The main pulmonary arteries within normal limits for size.  Borderline cardiomegaly. Atherosclerotic vascular calcifications noted in the left main, left anterior descending, circumflex and likely the right coronary arteries.  There is no pericardial effusion.  An aberrant right subclavian artery noted incidentally.  The thoracic aorta is ectatic  and tortuous with scattered atherosclerotic vascular calcifications but no aneurysmal dilatation or dissection.  Lungs/Pleura:  Patchy foci sig of ground-glass attenuation opacity and a bronchovascular distribution in the left upper and right middle lobes consistent with an underlying infectious/inflammatory process.  Dependent atelectasis is noted in the bilateral lower lobes.  There is diffuse bronchial wall thickening.  An elongated area of rounded consolidation medially in the left lower lobe measures 1.8 x 1.7 by 3.4 cm in diameter.  No pleural effusion or pneumothorax.  Additional scattered small pulmonary nodules are nonspecific but likely infectious/inflammatory/granulomatous.  A calcified granuloma is  noted in the right lower lobe.  Upper Abdomen: Exophytic water attenuation lesions exophytic from the upper poles of the kidneys measure 2.5 cm on the left than 1.7 cm on the right.  Otherwise, limited imaging of the upper abdomen is unremarkable.  Bones: No acute fracture or aggressive appearing lytic or blastic osseous lesion.  IMPRESSION:  1.  Negative pulmonary embolus.  2.  Bronchovascular distribution of patchy ground-glass opacities in the left upper and right middle lobes most consistent with multi focal infectious/inflammatory process including pneumonia.  3.  An elongated, round and mass-like focus of consolidation in the medial left lower lobe is favored to also reflect an infectious/inflammatory process such as round  pneumonia.  However, given the mass-like appearance on the axial images, an underlying primary bronchogenic carcinoma cannot be excluded.  Recommend short interval follow-up chest CT in 4 - 6 weeks following an appropriate course of therapy.  If the lesion persists, further evaluation with PET CT or biopsy may be warranted.  4.  Prominent right hilar lymphoid tissue is presumed to be reactive.  This can also be further evaluated on follow-up chest CT.  5.  Atherosclerosis including left main and three-vessel coronary artery disease  6.  Aberrant right subclavian artery noted incidentally.   Original Report Authenticated By: Malachy Moan, M.D.     Principal Problem:   Community acquired pneumonia Active Problems:   Hyponatremia   Generalized weakness   Fall at home   Diabetes mellitus type 2 in nonobese   Ankle fracture   Parkinson's disease   Assessment/Plan 1. Multifocal pneumonia: empiric abx, sputum culture, oxygen as needed. 2. Hyponatremia, dehydration: IV fluids. Recheck basic metabolic panel morning. 3. Generalized weakness with falls at home consult physical therapy: Physical therapy evaluation. Early mobility desired. Nutrition consult. Patient somewhat  reluctant in providing history, suspect generalized weakness has been a significant issue at home. 4. DM type II diet-controlled: Appears stable. Sliding-scale insulin. 5. Recent ankle fracture: Continue boot. Followup with orthopedics as an outpatient. 6. Parkinson's disease: Appears stable. Taken off his medications by his neurologist. 7. Possible left lower lobe mass: Favored to be infectious. Consider repeat chest CT for-6 weeks to ensure resolution. 8. Atherosclerosis including left main and three-vessel coronary artery disease by chest CT: Asymptomatic. Consider outpatient evaluation.  Code Status: Full code at this time, patient wishes to defer final decision to his wife who is currently not available by phone. Family Communication: None present, unable to reach wife by phone Disposition Plan/Anticipated LOS: admit inpatient 2-3 days  Time spent: 60 minutes  Brendia Sacks, MD  [facility] Pager [phone] 07/24/2012, 10:14 PM

## 2012-07-24 NOTE — ED Notes (Signed)
Attempted in and out cath. Met with resistance and unable to advance to bladder. Will attempt to collect sample in a urinal, however, pt is incontinent and uses incontinence briefs.

## 2012-07-24 NOTE — ED Notes (Addendum)
Called to floor RN, Marchelle Folks

## 2012-07-24 NOTE — ED Notes (Signed)
ZOX:[id number]<AV> Expected date:<BR> Expected time:<BR> Means of arrival:<BR> Comments:<BR> Cough/fever

## 2012-07-24 NOTE — ED Notes (Signed)
Per ems: pt from home, was called for "stroke like symptoms" - ems did not find any stroke symptoms, pt c/o generalized weakness, decreased appetite, febrile 101 temp, and non-productive cough. Reports power just came back on today, pt has been staying in "cold house". 1000mg  tylenol PO given by EMS. Pt has boot on right foot, 81-57 week old fx. Borderline diabetic, cbg 127. bp 110/59, pulse 104, lung fields clear. 20 gauge in left hand, 200mg  NS given in route.

## 2012-07-25 LAB — LEGIONELLA ANTIGEN, URINE: Legionella Antigen, Urine: NEGATIVE

## 2012-07-25 LAB — CBC
MCH: 31.9 pg (ref 26.0–34.0)
MCV: 94 fL (ref 78.0–100.0)
Platelets: 251 10*3/uL (ref 150–400)
RDW: 12.6 % (ref 11.5–15.5)

## 2012-07-25 LAB — STREP PNEUMONIAE URINARY ANTIGEN: Strep Pneumo Urinary Antigen: POSITIVE — AB

## 2012-07-25 LAB — BASIC METABOLIC PANEL
BUN: 24 mg/dL — ABNORMAL HIGH (ref 6–23)
Calcium: 8.1 mg/dL — ABNORMAL LOW (ref 8.4–10.5)
Creatinine, Ser: 1.09 mg/dL (ref 0.50–1.35)
GFR calc Af Amer: 69 mL/min — ABNORMAL LOW (ref >90)
GFR calc non Af Amer: 59 mL/min — ABNORMAL LOW (ref >90)

## 2012-07-25 MED ORDER — PANTOPRAZOLE SODIUM 40 MG PO TBEC
40.0000 mg | DELAYED_RELEASE_TABLET | Freq: Every day | ORAL | Status: DC
  Administered 2012-07-25 – 2012-07-29 (×5): 40 mg via ORAL
  Filled 2012-07-25 (×5): qty 1

## 2012-07-25 MED ORDER — GLUCERNA SHAKE PO LIQD
237.0000 mL | Freq: Three times a day (TID) | ORAL | Status: DC
  Administered 2012-07-25 – 2012-07-29 (×10): 237 mL via ORAL
  Filled 2012-07-25 (×15): qty 237

## 2012-07-25 MED ORDER — ENOXAPARIN SODIUM 40 MG/0.4ML ~~LOC~~ SOLN
40.0000 mg | SUBCUTANEOUS | Status: DC
  Administered 2012-07-25 – 2012-07-26 (×2): 40 mg via SUBCUTANEOUS
  Filled 2012-07-25 (×2): qty 0.4

## 2012-07-25 MED ORDER — SODIUM CHLORIDE 0.9 % IV SOLN: INTRAVENOUS | Status: AC

## 2012-07-25 MED ORDER — ACETAMINOPHEN 325 MG PO TABS: 650.0000 mg | ORAL_TABLET | Freq: Four times a day (QID) | ORAL | Status: DC | PRN

## 2012-07-25 MED ORDER — LEVOFLOXACIN IN D5W 750 MG/150ML IV SOLN: 750.0000 mg | INTRAVENOUS | Status: DC

## 2012-07-25 MED ORDER — ACETAMINOPHEN 650 MG RE SUPP: 650.0000 mg | Freq: Four times a day (QID) | RECTAL | Status: DC | PRN

## 2012-07-25 MED ORDER — POTASSIUM CHLORIDE CRYS ER 10 MEQ PO TBCR
10.0000 meq | EXTENDED_RELEASE_TABLET | Freq: Four times a day (QID) | ORAL | Status: DC
  Administered 2012-07-25 – 2012-07-29 (×18): 10 meq via ORAL
  Filled 2012-07-25 (×20): qty 1

## 2012-07-25 MED ORDER — INSULIN ASPART 100 UNIT/ML ~~LOC~~ SOLN
0.0000 [IU] | Freq: Three times a day (TID) | SUBCUTANEOUS | Status: DC
  Administered 2012-07-25: 2 [IU] via SUBCUTANEOUS
  Administered 2012-07-26 – 2012-07-27 (×3): 1 [IU] via SUBCUTANEOUS
  Administered 2012-07-28: 2 [IU] via SUBCUTANEOUS
  Administered 2012-07-29 (×2): 1 [IU] via SUBCUTANEOUS

## 2012-07-25 MED ORDER — METOPROLOL SUCCINATE ER 25 MG PO TB24
25.0000 mg | ORAL_TABLET | Freq: Every day | ORAL | Status: DC
  Administered 2012-07-25 – 2012-07-29 (×5): 25 mg via ORAL
  Filled 2012-07-25 (×5): qty 1

## 2012-07-25 MED ORDER — ONDANSETRON HCL 4 MG/2ML IJ SOLN: 4.0000 mg | Freq: Four times a day (QID) | INTRAMUSCULAR | Status: DC | PRN

## 2012-07-25 MED ORDER — LEVOFLOXACIN IN D5W 750 MG/150ML IV SOLN
750.0000 mg | INTRAVENOUS | Status: DC
  Administered 2012-07-25 – 2012-07-27 (×2): 750 mg via INTRAVENOUS
  Filled 2012-07-25 (×3): qty 150

## 2012-07-25 MED ORDER — ONDANSETRON HCL 4 MG PO TABS: 4.0000 mg | ORAL_TABLET | Freq: Four times a day (QID) | ORAL | Status: DC | PRN

## 2012-07-25 MED ORDER — SODIUM CHLORIDE 0.9 % IV SOLN
INTRAVENOUS | Status: DC
  Administered 2012-07-25 – 2012-07-26 (×2): via INTRAVENOUS

## 2012-07-25 MED ORDER — ALUM & MAG HYDROXIDE-SIMETH 200-200-20 MG/5ML PO SUSP: 30.0000 mL | Freq: Four times a day (QID) | ORAL | Status: DC | PRN

## 2012-07-25 MED ORDER — DIPHENHYDRAMINE HCL 25 MG PO CAPS
50.0000 mg | ORAL_CAPSULE | Freq: Every day | ORAL | Status: DC
  Administered 2012-07-26 – 2012-07-28 (×3): 50 mg via ORAL
  Filled 2012-07-25 (×7): qty 2

## 2012-07-25 MED ORDER — FINASTERIDE 5 MG PO TABS
5.0000 mg | ORAL_TABLET | Freq: Every day | ORAL | Status: DC
  Administered 2012-07-25 – 2012-07-29 (×5): 5 mg via ORAL
  Filled 2012-07-25 (×5): qty 1

## 2012-07-25 MED ORDER — ASPIRIN EC 81 MG PO TBEC
81.0000 mg | DELAYED_RELEASE_TABLET | ORAL | Status: DC
  Administered 2012-07-25 – 2012-07-29 (×3): 81 mg via ORAL
  Filled 2012-07-25 (×4): qty 1

## 2012-07-25 NOTE — Progress Notes (Signed)
Clinical Social Work Department BRIEF PSYCHOSOCIAL ASSESSMENT 07/25/2012  Patient:  Tim Black, Tim Black     Account Number:  [id number]     Admit date:  07/24/2012  Clinical Social Worker:  Hattie Perch  Date/Time:  07/25/2012 12:00 M  Referred by:  Physician  Date Referred:  07/25/2012 Referred for  SNF Placement   Other Referral:   Interview type:  Patient Other interview type:    PSYCHOSOCIAL DATA Living Status:  FAMILY Admitted from facility:   Level of care:   Primary support name:  Wilburt Finlay Primary support relationship to patient:  SPOUSE Degree of support available:   good    CURRENT CONCERNS Current Concerns  Post-Acute Placement   Other Concerns:    SOCIAL WORK ASSESSMENT / PLAN CSW spoke with patient. Patient is alert and oriented X3. patient in need of snf placement. Patient agreeable to snf search as he does not know where he would like to go at this point. CSW explained search process.   Assessment/plan status:   Other assessment/ plan:   Information/referral to community resources:    PATIENT'S/FAMILY'S RESPONSE TO PLAN OF CARE: Patient is agreeable to snf search and recieving bed offers.

## 2012-07-25 NOTE — Progress Notes (Signed)
  Pharmacy Note (Brief) - Renal Abx Adjustment  79yo M admitted 3/12 with CAP, started on 5 day course of Levaquin 750mg  IV q24h. SCr = 1.09, CrCl~13ml/min, WBC = 22, Tm=100, blood and urine cultures have been sent. Will renally-adjust Levaquin for CrCl<50 ml/min.  1) Change Levaquin to 750mg  IV q48h for 5 days total  Darrol Angel, PharmD Pager: [phone] 07/25/2012 7:28 AM

## 2012-07-25 NOTE — Progress Notes (Addendum)
[facility] PROGRESS NOTE  Tim Black ZOX:[id number] DOB: [birth year] DOA: 07/24/2012 PCP: [name], MD  Brief narrative 79 y/o male with hx of HTN, GERD, Parkinson's disease presenting with a large weakness and poor appetite. CT scan of the chest done in the ED showed multifocal pneumonia.  Assessment/Plan:  Multifocal pneumonia Continue Levaquin. Sputum culture ordered. Urine strep ag positive. leukocytosis on admission. We'll monitor closely. This morning. Possible left lower mass seen on CT scan likely to be infectious. Repeat chest CT needed in the 4-6 weeks to confirm resolution. continue IV fluids.   Parkinson's Disease taken off his medications by his neurologist  Hyponatremia Likely significantly hydration improving  Recent ankle fracture Continue with boot. Orthopedics follow up as outpatient.  Generalized weakness Physical therapy eval and nutrition consult.  Code Status: full code Family Communication: none at bedside Disposition Plan: home vs SNF   Consultants:  NONE  Procedures:  none  Antibiotics:  levaquin ( 3/12---)  HPI/Subjective: Patient seen and examined this morning. Denies any specific symptoms.  Objective: Filed Vitals:   07/24/12 2200 07/24/12 2300 07/25/12 0020 07/25/12 0547  BP: 93/51 96/47 101/58 100/56  Pulse: 77 78 79 76  Temp:   98.9 F (37.2 C) 98.6 F (37 C)  TempSrc:   Oral Oral  Resp: 21 19 20 20   Height:   5\' 8"  (1.727 m)   Weight:   61.236 kg (135 lb)   SpO2: 95% 96% 96% 95%    Intake/Output Summary (Last 24 hours) at 07/25/12 1244 Last data filed at 07/25/12 1000  Gross per 24 hour  Intake   2110 ml  Output    750 ml  Net   1360 ml   Filed Weights   07/25/12 0020  Weight: 61.236 kg (135 lb)    Exam:   General:  Elderly male in no acute distress  HEENT: No pallor, moist oral mucosa  Cardiovascular: S1 and S2, no murmurs rub or gallop  Respiratory: Bibasilar crackles no rhonchi  or wheeze  Abdomen: , Nontender, nondistended, bowel sounds present  Musculoskeletal: , No edema  CNS: AAO x2, nonfocal  Data Reviewed: Basic Metabolic Panel:  Recent Labs Lab 07/24/12 1810 07/25/12 0350  NA 129* 133*  K 4.0 3.9  CL 96 101  CO2 22 25  GLUCOSE 126* 93  BUN 28* 24*  CREATININE 1.06 1.09  CALCIUM 8.2* 8.1*   Liver Function Tests:  Recent Labs Lab 07/24/12 1810  AST 17  ALT 12  ALKPHOS 122*  BILITOT 0.4  PROT 6.8  ALBUMIN 2.7*   No results found for this basename: LIPASE, AMYLASE,  in the last 168 hours No results found for this basename: AMMONIA,  in the last 168 hours CBC:  Recent Labs Lab 07/24/12 1810 07/25/12 0350  WBC 22.6* 22.0*  NEUTROABS 20.4*  --   HGB 11.7* 10.6*  HCT 34.3* 31.2*  MCV 93.5 94.0  PLT 258 251   Cardiac Enzymes: No results found for this basename: CKTOTAL, CKMB, CKMBINDEX, TROPONINI,  in the last 168 hours BNP (last 3 results) No results found for this basename: PROBNP,  in the last 8760 hours CBG:  Recent Labs Lab 07/25/12 0933  GLUCAP 111*    No results found for this or any previous visit (from the past 240 hour(s)).   Studies: Dg Chest 2 View  07/24/2012  *RADIOLOGY REPORT*  Clinical Data: Fever, cough, weakness, history diabetes, hypertension  CHEST - 2 VIEW  Comparison: 06/01/2011  Findings: Upper normal  heart size. Calcified tortuous aorta. Normal mediastinal contours and pulmonary vascularity otherwise seen. Minimal basilar atelectasis. Lungs otherwise clear. No pleural effusion or pneumothorax. Bones demineralized.  IMPRESSION: Minimal basilar atelectasis.   Original Report Authenticated By: Ulyses Southward, M.D.    Ct Angio Chest W/cm &/or Wo Cm  07/24/2012  *RADIOLOGY REPORT*  Clinical Data: Short of breath, cough  CT ANGIOGRAPHY CHEST  Technique:  Multidetector CT imaging of the chest using the standard protocol during bolus administration of intravenous contrast. Multiplanar reconstructed images  including MIPs were obtained and reviewed to evaluate the vascular anatomy.  Contrast: OMNIPAQUE IOHEXOL 350 MG/ML SOLN  Comparison: Chest x-ray earlier today 07/24/2012 at 17 44 p.m.; CT abdomen/pelvis 06/06/2010  Findings:  Mediastinum: Unremarkable CT appearance of the thyroid gland.  No suspicious mediastinal adenopathy.  Prominent right hilar lymphoid tissue measures up to 1.7 cm.  No soft tissue mediastinal mass. The thoracic esophagus is unremarkable.  Heart/Vascular: Excellent opacification of the pulmonary arteries to the proximal subsegmental level. No central filling defect to suggest acute pulmonary embolus.  The main pulmonary arteries within normal limits for size.  Borderline cardiomegaly. Atherosclerotic vascular calcifications noted in the left main, left anterior descending, circumflex and likely the right coronary arteries.  There is no pericardial effusion.  An aberrant right subclavian artery noted incidentally.  The thoracic aorta is ectatic and tortuous with scattered atherosclerotic vascular calcifications but no aneurysmal dilatation or dissection.  Lungs/Pleura:  Patchy foci sig of ground-glass attenuation opacity and a bronchovascular distribution in the left upper and right middle lobes consistent with an underlying infectious/inflammatory process.  Dependent atelectasis is noted in the bilateral lower lobes.  There is diffuse bronchial wall thickening.  An elongated area of rounded consolidation medially in the left lower lobe measures 1.8 x 1.7 by 3.4 cm in diameter.  No pleural effusion or pneumothorax.  Additional scattered small pulmonary nodules are nonspecific but likely infectious/inflammatory/granulomatous.  A calcified granuloma is noted in the right lower lobe.  Upper Abdomen: Exophytic water attenuation lesions exophytic from the upper poles of the kidneys measure 2.5 cm on the left than 1.7 cm on the right.  Otherwise, limited imaging of the upper abdomen is  unremarkable.  Bones: No acute fracture or aggressive appearing lytic or blastic osseous lesion.  IMPRESSION:  1.  Negative pulmonary embolus.  2.  Bronchovascular distribution of patchy ground-glass opacities in the left upper and right middle lobes most consistent with multi focal infectious/inflammatory process including pneumonia.  3.  An elongated, round and mass-like focus of consolidation in the medial left lower lobe is favored to also reflect an infectious/inflammatory process such as round  pneumonia.  However, given the mass-like appearance on the axial images, an underlying primary bronchogenic carcinoma cannot be excluded.  Recommend short interval follow-up chest CT in 4 - 6 weeks following an appropriate course of therapy.  If the lesion persists, further evaluation with PET CT or biopsy may be warranted.  4.  Prominent right hilar lymphoid tissue is presumed to be reactive.  This can also be further evaluated on follow-up chest CT.  5.  Atherosclerosis including left main and three-vessel coronary artery disease  6.  Aberrant right subclavian artery noted incidentally.   Original Report Authenticated By: Malachy Moan, M.D.     Scheduled Meds: . aspirin EC  81 mg Oral QODAY  . diphenhydrAMINE  50 mg Oral QHS  . enoxaparin (LOVENOX) injection  40 mg Subcutaneous Q24H  . feeding supplement  237 mL  Oral TID BM  . finasteride  5 mg Oral Q lunch  . insulin aspart  0-9 Units Subcutaneous TID WC  . levofloxacin (LEVAQUIN) IV  750 mg Intravenous Q48H  . metoprolol succinate  25 mg Oral Q lunch  . pantoprazole  40 mg Oral Daily  . potassium chloride  10 mEq Oral QID   Continuous Infusions:     Time spent: 25 minutes    Aliyana Dlugosz  [facility] Pager [phone]. If 7PM-7AM, please contact night-coverage at [email or web address], password [facility] 07/25/2012, 12:44 PM  LOS: 1 day

## 2012-07-25 NOTE — Progress Notes (Signed)
INITIAL NUTRITION ASSESSMENT  DOCUMENTATION CODES Per approved criteria  -Not Applicable   INTERVENTION: - Recommend PT consult and case manager consult for home health PT from [facility] per pt request as he has used these services before and knows that he is at high risk for recurrent falls - Glucerna shake TID - Will continue to monitor  NUTRITION DIAGNOSIS: Inadequate oral intake related to decreased appetite as evidenced by pt statement.   Goal: Pt to consume >90% of meals/supplements  Monitor:  Weights, labs, intake   Reason for Assessment: Consult  79 y.o. male  Admitting Dx: Community acquired pneumonia  ASSESSMENT: Pt admitted with generalized weakness. Pt with Parkinson's disease and his wife is his caretaker. Pt reports she makes him high protein meals/snacks and that he drinks a high protein drink once/day, could not recall the name. Pt reports his appetite has gone down which he attributes to his Parkinson's disease. Pt denies any problems chewing/swallowing. Performed nutrition focused physical exam.   Nutrition Focused Physical Exam:  Subcutaneous Fat:  Orbital Region: WNL Upper Arm Region: N/A Thoracic and Lumbar Region: N/A  Muscle:  Temple Region: WNL Clavicle Bone Region: N/A Clavicle and Acromion Bone Region: N/A Scapular Bone Region: WNL Dorsal Hand: WNL Patellar Region: WNL Anterior Thigh Region: WNL Posterior Calf Region: WNL  Edema: None observed   Height: Ht Readings from Last 1 Encounters:  07/25/12 5\' 8"  (1.727 m)    Weight: Wt Readings from Last 1 Encounters:  07/25/12 135 lb (61.236 kg)    Ideal Body Weight: 154 lb  % Ideal Body Weight: 88  Wt Readings from Last 10 Encounters:  07/25/12 135 lb (61.236 kg)  04/20/11 134 lb 11.2 oz (61.1 kg)    Usual Body Weight: 150 lb last year per pt  % Usual Body Weight: 90  BMI:  Body mass index is 20.53 kg/(m^2).  Estimated Nutritional Needs: Kcal: 1550-1850 Protein:  75-85g Fluid: 1.5-1.8L/day  Skin: Boot on right lower extremity from fall PTA  Diet Order: General  EDUCATION NEEDS: -No education needs identified at this time   Intake/Output Summary (Last 24 hours) at 07/25/12 1108 Last data filed at 07/25/12 1000  Gross per 24 hour  Intake   2110 ml  Output    750 ml  Net   1360 ml    Last BM: 3/12  Labs:   Recent Labs Lab 07/24/12 1810 07/25/12 0350  NA 129* 133*  K 4.0 3.9  CL 96 101  CO2 22 25  BUN 28* 24*  CREATININE 1.06 1.09  CALCIUM 8.2* 8.1*  GLUCOSE 126* 93    CBG (last 3)  No results found for this basename: GLUCAP,  in the last 72 hours  Scheduled Meds: . aspirin EC  81 mg Oral QODAY  . diphenhydrAMINE  50 mg Oral QHS  . enoxaparin (LOVENOX) injection  40 mg Subcutaneous Q24H  . finasteride  5 mg Oral Q lunch  . insulin aspart  0-9 Units Subcutaneous TID WC  . levofloxacin (LEVAQUIN) IV  750 mg Intravenous Q48H  . metoprolol succinate  25 mg Oral Q lunch  . pantoprazole  40 mg Oral Daily  . potassium chloride  10 mEq Oral QID    Continuous Infusions:   Past Medical History  Diagnosis Date  . Hypertension   . GERD (gastroesophageal reflux disease)   . BPH (benign prostatic hyperplasia)   . Diabetes mellitus     diet controlled, mild  . Fall down stairs 03/2012  .  Parkinson's disease     Past Surgical History  Procedure Laterality Date  . Hernia repair    . Cataract extraction  2010    both eyes     Levon Hedger MS, RD, [place] [phone] Pager [phone] After Hours Pager

## 2012-07-25 NOTE — ED Provider Notes (Signed)
History    79y male with generalized weakness. Patient having difficulty ambulating. He has a history of Parkinson's disease and walks with assistance of walker baseline. He's been unable to do even this for past day. Has fallen. Cough and mild shortness of breath. Reports fever 101. Complaining of pain in his right anklle. Patient with recent ankle fracture approximately 3 weeks ago. Denies any pain anywhere else. No confusion per family. Nourinary complaints.  CSN: [id number]  Arrival date & time 07/24/12  1628   First MD Initiated Contact with Patient 07/24/12 1812      Chief Complaint  Patient presents with  . Fever  . Cough    (Consider location/radiation/quality/duration/timing/severity/associated sxs/prior treatment) HPI  Past Medical History  Diagnosis Date  . Hypertension   . GERD (gastroesophageal reflux disease)   . BPH (benign prostatic hyperplasia)   . Diabetes mellitus     diet controlled, mild  . Fall down stairs 03/2012  . Parkinson's disease     Past Surgical History  Procedure Laterality Date  . Hernia repair    . Cataract extraction  2010    both eyes    Family History  Problem Relation Age of Onset  . Coronary artery disease Father 22    Died from MI  . Coronary artery disease Brother 89    ddied form massive MI  . Brain cancer Brother     History  Substance Use Topics  . Smoking status: Former Smoker -- 1.00 packs/day for 10 years    Types: Cigarettes, Cigars    Quit date: 07/24/1992  . Smokeless tobacco: Never Used  . Alcohol Use: No      Review of Systems  All systems reviewed and negative, other than as noted in HPI.   Allergies  Review of patient's allergies indicates no known allergies.  Home Medications   Current Outpatient Rx  Name  Route  Sig  Dispense  Refill  . aspirin 81 MG EC tablet   Oral   Take 81 mg by mouth every other day. Swallow whole.         . diphenhydrAMINE (BENADRYL) 25 MG tablet   Oral   Take 50  mg by mouth at bedtime.         . finasteride (PROSCAR) 5 MG tablet   Oral   Take 5 mg by mouth daily with lunch.          . fish oil-omega-3 fatty acids 1000 MG capsule   Oral   Take 4 g by mouth daily with lunch.         . lisinopril-hydrochlorothiazide (PRINZIDE,ZESTORETIC) 10-12.5 MG per tablet   Oral   Take 1 tablet by mouth daily with lunch.          . metoprolol succinate (TOPROL-XL) 50 MG 24 hr tablet   Oral   Take 25 mg by mouth daily with lunch. Take with or immediately following a meal.         . Multiple Vitamin (MULTIVITAMIN WITH MINERALS) TABS   Oral   Take 1 tablet by mouth daily with lunch.         Marland Kitchen omeprazole (PRILOSEC) 20 MG capsule   Oral   Take 20 mg by mouth daily with lunch.          . potassium chloride (K-DUR,KLOR-CON) 10 MEQ tablet   Oral   Take 10 mEq by mouth 4 (four) times daily.           BP  96/47  Pulse 78  Temp(Src) 100 F (37.8 C) (Oral)  Resp 19  SpO2 96%  Physical Exam  Nursing note and vitals reviewed. Constitutional: He appears well-developed and well-nourished. No distress.  Frail appearing, but not toxic  HENT:  Head: Normocephalic and atraumatic.  Eyes: Conjunctivae are normal. Right eye exhibits no discharge. Left eye exhibits no discharge.  Neck: Neck supple.  Cardiovascular: Normal rate, regular rhythm and normal heart sounds.  Exam reveals no gallop and no friction rub.   No murmur heard. Pulmonary/Chest: Effort normal. No respiratory distress.  l lower lobe rhonchi  Abdominal: Soft. He exhibits no distension. There is no tenderness.  Musculoskeletal: He exhibits no edema and no tenderness.  R foot in cam walker  Neurological: He is alert.  Skin: Skin is warm and dry.  Psychiatric: He has a normal mood and affect. His behavior is normal. Thought content normal.    ED Course  Procedures (including critical care time)  Labs Reviewed  CBC WITH DIFFERENTIAL - Abnormal; Notable for the following:     WBC 22.6 (*)    RBC 3.67 (*)    Hemoglobin 11.7 (*)    HCT 34.3 (*)    Neutrophils Relative 91 (*)    Neutro Abs 20.4 (*)    Lymphocytes Relative 2 (*)    Lymphs Abs 0.5 (*)    Monocytes Absolute 1.6 (*)    All other components within normal limits  COMPREHENSIVE METABOLIC PANEL - Abnormal; Notable for the following:    Sodium 129 (*)    Glucose, Bld 126 (*)    BUN 28 (*)    Calcium 8.2 (*)    Albumin 2.7 (*)    Alkaline Phosphatase 122 (*)    GFR calc non Af Amer 61 (*)    GFR calc Af Amer 71 (*)    All other components within normal limits  URINALYSIS, ROUTINE W REFLEX MICROSCOPIC - Abnormal; Notable for the following:    Specific Gravity, Urine 1.034 (*)    Hgb urine dipstick TRACE (*)    All other components within normal limits  CULTURE, BLOOD (ROUTINE X 2)  CULTURE, BLOOD (ROUTINE X 2)  URINE CULTURE  URINE MICROSCOPIC-ADD ON  POCT LACTIC ACID (LACTATE)   Dg Chest 2 View  07/24/2012  *RADIOLOGY REPORT*  Clinical Data: Fever, cough, weakness, history diabetes, hypertension  CHEST - 2 VIEW  Comparison: 06/01/2011  Findings: Upper normal heart size. Calcified tortuous aorta. Normal mediastinal contours and pulmonary vascularity otherwise seen. Minimal basilar atelectasis. Lungs otherwise clear. No pleural effusion or pneumothorax. Bones demineralized.  IMPRESSION: Minimal basilar atelectasis.   Original Report Authenticated By: Ulyses Southward, M.D.    Ct Angio Chest W/cm &/or Wo Cm  07/24/2012  *RADIOLOGY REPORT*  Clinical Data: Short of breath, cough  CT ANGIOGRAPHY CHEST  Technique:  Multidetector CT imaging of the chest using the standard protocol during bolus administration of intravenous contrast. Multiplanar reconstructed images including MIPs were obtained and reviewed to evaluate the vascular anatomy.  Contrast: OMNIPAQUE IOHEXOL 350 MG/ML SOLN  Comparison: Chest x-ray earlier today 07/24/2012 at 17 44 p.m.; CT abdomen/pelvis 06/06/2010  Findings:  Mediastinum:  Unremarkable CT appearance of the thyroid gland.  No suspicious mediastinal adenopathy.  Prominent right hilar lymphoid tissue measures up to 1.7 cm.  No soft tissue mediastinal mass. The thoracic esophagus is unremarkable.  Heart/Vascular: Excellent opacification of the pulmonary arteries to the proximal subsegmental level. No central filling defect to suggest acute pulmonary embolus.  The main pulmonary arteries within normal limits for size.  Borderline cardiomegaly. Atherosclerotic vascular calcifications noted in the left main, left anterior descending, circumflex and likely the right coronary arteries.  There is no pericardial effusion.  An aberrant right subclavian artery noted incidentally.  The thoracic aorta is ectatic and tortuous with scattered atherosclerotic vascular calcifications but no aneurysmal dilatation or dissection.  Lungs/Pleura:  Patchy foci sig of ground-glass attenuation opacity and a bronchovascular distribution in the left upper and right middle lobes consistent with an underlying infectious/inflammatory process.  Dependent atelectasis is noted in the bilateral lower lobes.  There is diffuse bronchial wall thickening.  An elongated area of rounded consolidation medially in the left lower lobe measures 1.8 x 1.7 by 3.4 cm in diameter.  No pleural effusion or pneumothorax.  Additional scattered small pulmonary nodules are nonspecific but likely infectious/inflammatory/granulomatous.  A calcified granuloma is noted in the right lower lobe.  Upper Abdomen: Exophytic water attenuation lesions exophytic from the upper poles of the kidneys measure 2.5 cm on the left than 1.7 cm on the right.  Otherwise, limited imaging of the upper abdomen is unremarkable.  Bones: No acute fracture or aggressive appearing lytic or blastic osseous lesion.  IMPRESSION:  1.  Negative pulmonary embolus.  2.  Bronchovascular distribution of patchy ground-glass opacities in the left upper and right middle lobes most  consistent with multi focal infectious/inflammatory process including pneumonia.  3.  An elongated, round and mass-like focus of consolidation in the medial left lower lobe is favored to also reflect an infectious/inflammatory process such as round  pneumonia.  However, given the mass-like appearance on the axial images, an underlying primary bronchogenic carcinoma cannot be excluded.  Recommend short interval follow-up chest CT in 4 - 6 weeks following an appropriate course of therapy.  If the lesion persists, further evaluation with PET CT or biopsy may be warranted.  4.  Prominent right hilar lymphoid tissue is presumed to be reactive.  This can also be further evaluated on follow-up chest CT.  5.  Atherosclerosis including left main and three-vessel coronary artery disease  6.  Aberrant right subclavian artery noted incidentally.   Original Report Authenticated By: Malachy Moan, M.D.      1. CAP (community acquired pneumonia)   2. Generalized weakness   3. Parkinson's disease   4. Diabetes mellitus type 2 in nonobese   5. Fall at home, initial encounter       MDM  79 year old male with generalized weakness and cough. Chest x-ray is clear. Some concern for possible pulmonary given patient's recent ankle fracture and Cam Walker usage. CT is negative for pulmonary embolism but does have findings consistent with pneumonia. Will treat for community-acquired pneumonia given patient's cough, CT findings and leukocytosis.        Raeford Razor, MD 07/25/12 [phone]

## 2012-07-26 LAB — BASIC METABOLIC PANEL
BUN: 19 mg/dL (ref 6–23)
CO2: 25 mEq/L (ref 19–32)
Calcium: 8.3 mg/dL — ABNORMAL LOW (ref 8.4–10.5)
Creatinine, Ser: 0.95 mg/dL (ref 0.50–1.35)
Glucose, Bld: 105 mg/dL — ABNORMAL HIGH (ref 70–99)
Sodium: 135 mEq/L (ref 135–145)

## 2012-07-26 LAB — GLUCOSE, CAPILLARY
Glucose-Capillary: 119 mg/dL — ABNORMAL HIGH (ref 70–99)
Glucose-Capillary: 102 mg/dL — ABNORMAL HIGH (ref 70–99)

## 2012-07-26 LAB — URINE CULTURE
Colony Count: NO GROWTH
Culture: NO GROWTH

## 2012-07-26 LAB — CBC
HCT: 31.6 % — ABNORMAL LOW (ref 39.0–52.0)
Hemoglobin: 10.8 g/dL — ABNORMAL LOW (ref 13.0–17.0)
MCH: 31.9 pg (ref 26.0–34.0)
MCV: 93.2 fL (ref 78.0–100.0)
RBC: 3.39 MIL/uL — ABNORMAL LOW (ref 4.22–5.81)

## 2012-07-26 NOTE — Evaluation (Signed)
Physical Therapy Evaluation Patient Details Name: Tim Black MRN: [id number] DOB: 09-Jan-1924 Today's Date: 07/26/2012 Time: 4098-1191 PT Time Calculation (min): 25 min  PT Assessment / Plan / Recommendation Clinical Impression  Pt is 79 yo male admitted 07/24/12  with stroke like symptoms , decreased mobility, R ankle fx x 4 weeks, h/o Parkinson's. Dx. pneumonia. Pt. was living at home with wife. No indication of any limits to weight bearing on R. Pt. did put weight on R. Assume WBAT as pt has been home and has not mobilized w/ WC. Wife not present. Pt. will benefit from PT while in acute care to improve safety and mobility. Recommend STSNF. May consider Ortho MD f/u while in hospital as was planned appt. 07/25/12 which pt missed.    PT Assessment  Patient needs continued PT services    Follow Up Recommendations  SNF    Does the patient have the potential to tolerate intense rehabilitation      Barriers to Discharge Decreased caregiver support      Equipment Recommendations  None recommended by PT    Recommendations for Other Services     Frequency Min 3X/week    Precautions / Restrictions Precautions Precaution Comments: droplet, falls Required Braces or Orthoses: Other Brace/Splint Other Brace/Splint: R CAM Restrictions Other Position/Activity Restrictions: pt has H/O R ankle fx, no notes to indicate weight bearing restrictions. Pt states he had appt. w/ orthe 3/13--cancelled/   Pertinent Vitals/Pain       Mobility  Bed Mobility Bed Mobility: Supine to Sit Supine to Sit: 5: Supervision Details for Bed Mobility Assistance: frequent cues to keep scooting. Transfers Transfers: Sit to Stand;Stand to [employer] Transfers Sit to Stand: 4: Min assist;With upper extremity assist;From bed Stand to Sit: To chair/3-in-1;With armrests;4: Min assist Stand Pivot Transfers: 4: Min assist Details for Transfer Assistance: frequent cues to keep turning to get  infront of  recliner, tactile cues to reach back to armrests. Ambulation/Gait Ambulation/Gait Assistance: Not tested (comment)    Exercises     PT Diagnosis: Difficulty walking;Generalized weakness  PT Problem List: Decreased strength;Decreased activity tolerance;Decreased balance;Decreased mobility;Decreased safety awareness;Decreased knowledge of use of DME PT Treatment Interventions: DME instruction;Gait training;Functional mobility training;Therapeutic activities;Therapeutic exercise;Patient/family education   PT Goals Acute Rehab PT Goals PT Goal Formulation: With patient Time For Goal Achievement: 08/09/12 Potential to Achieve Goals: Good Pt will go Sit to Supine/Side: Independently PT Goal: Sit to Supine/Side - Progress: Goal set today Pt will go Sit to Stand: Independently PT Goal: Sit to Stand - Progress: Goal set today Pt will go Stand to Sit: with supervision PT Goal: Stand to Sit - Progress: Goal set today Pt will Transfer Bed to Chair/Chair to Bed: with supervision PT Transfer Goal: Bed to Chair/Chair to Bed - Progress: Goal set today Pt will Ambulate: 51 - 150 feet;with supervision;with rolling walker (if pt is WBAT.) PT Goal: Ambulate - Progress: Goal set today Pt will Perform Home Exercise Program: with supervision, verbal cues required/provided PT Goal: Perform Home Exercise Program - Progress: Goal set today  Visit Information  Last PT Received On: 07/26/12 Assistance Needed: +1    Subjective Data  Subjective: I am glad you are here Patient Stated Goal:  i am going to rehab   Prior Functioning  Home Living Lives With: Spouse Available Help at Discharge: Family Type of Home: House Home Access: Stairs to enter Secretary/administrator of Steps: 3-4 Entrance Stairs-Rails: Right;Left Home Layout: One level Firefighter: Standard Home Adaptive Equipment: Straight  cane;Walker - rolling Additional Comments: pt unable to provide all info.re home. Prior Function Level  of Independence: Needs assistance Needs Assistance: Gait;Transfers Gait Assistance: pt. reports he walks w/ RW in house. Did not know weight restrictions , if any. Communication Communication: No difficulties    Cognition  Cognition Overall Cognitive Status: Impaired Area of Impairment: Memory Arousal/Alertness: Awake/alert Orientation Level: Appears intact for tasks assessed (looked at calendar) Behavior During Session: [facility] for tasks performed    Extremity/Trunk Assessment Right Upper Extremity Assessment RUE ROM/Strength/Tone: [facility] for tasks assessed Left Upper Extremity Assessment LUE ROM/Strength/Tone: [facility] for tasks assessed Right Lower Extremity Assessment RLE ROM/Strength/Tone: [facility] for tasks assessed Left Lower Extremity Assessment LLE ROM/Strength/Tone: [facility] for tasks assessed   Balance Balance Balance Assessed: Yes Static Sitting Balance Static Sitting - Balance Support: No upper extremity supported Static Sitting - Level of Assistance: 5: Stand by assistance  End of Session PT - End of Session Equipment Utilized During Treatment: Gait belt;Right ankle foot orthosis Activity Tolerance: Patient tolerated treatment well Patient left: in chair;with call bell/phone within reach Nurse Communication: Mobility status  GP     Rada Hay 07/26/2012, 9:12 AM

## 2012-07-26 NOTE — Progress Notes (Signed)
Met patient's wife, Byrd Hesselbach, downstairs. Helped her get up to the patient's room and offered her support. She told me that she is originally from [place]. She came here to learn English and then met the patient and married him. They have been married 50 years. She was appreciative of support. Will continue to support spouse and patient.

## 2012-07-26 NOTE — Progress Notes (Signed)
Met with pt as a response to a spiritual care consult request. Pt indicated that he was feeling ok and hoped to be moved out of the hospital soon. Pt took extra time to respond to questions, but was very conversational and friendly. Pt seemed to be in good spirits, and thanked me for the visit. Pt appeared to be very cooperative and friendly with the other staff.

## 2012-07-26 NOTE — Progress Notes (Signed)
[facility] PROGRESS NOTE  Cabe Lashley ZOX:[id number] DOB: 09/05/1923 DOA: 07/24/2012 PCP: [name], MD  Brief narrative  79 y/o male with hx of HTN, GERD, Parkinson's disease presenting with a large weakness and poor appetite. CT scan of the chest done in the ED showed multifocal pneumonia.    Assessment/Plan:  Multifocal pneumonia  Continue Levaquin. Sputum culture ordered. Urine strep ag positive. Legionella antigen negative leukocytosis slowly improving. Possible left lower mass seen on CT scan likely to be infectious. Repeat chest CT needed in the 4-6 weeks to confirm resolution.  -Continue IV fluids  Parkinson's Disease  taken off his medications by his neurologist   Hyponatremia  Improved with hydration.  Recent right ankle fracture  Continue with boot. Patient missed his appointment yesterday. He was supposed to see Dr Luiz Blare. Have scheduled appt for 3/20 at 10 am.   Generalized weakness  Physical therapy eval and nutrition consult.   Code Status: full code  Family Communication: none at bedside   Disposition Plan: Skilled nursing facility. Referral sent   Consultants:  NONE Procedures:  none Antibiotics:  levaquin ( 3/12---)  HPI/Subjective: No overnight issues.  Objective: Filed Vitals:   07/25/12 0547 07/25/12 1400 07/25/12 2205 07/26/12 0531  BP: 100/56 120/50 111/59 104/58  Pulse: 76 78 77 76  Temp: 98.6 F (37 C) 98.1 F (36.7 C) 98.7 F (37.1 C) 98.6 F (37 C)  TempSrc: Oral Oral Oral Oral  Resp: 20 20 20 20   Height:      Weight:      SpO2: 95% 97% 97% 97%    Intake/Output Summary (Last 24 hours) at 07/26/12 1433 Last data filed at 07/26/12 1000  Gross per 24 hour  Intake   2370 ml  Output   1150 ml  Net   1220 ml   Filed Weights   07/25/12 0020  Weight: 61.236 kg (135 lb)    Exam:  General: Elderly male in no acute distress  HEENT: No pallor, moist oral mucosa  Cardiovascular: S1 and S2, no murmurs rub or  gallop  Respiratory: Bibasilar crackles no rhonchi or wheeze  Abdomen: , Nontender, nondistended, bowel sounds present  Musculoskeletal: , No edema , right ankle boot CNS: AAO x2, nonfocal   Data Reviewed: Basic Metabolic Panel:  Recent Labs Lab 07/24/12 1810 07/25/12 0350 07/26/12 0422  NA 129* 133* 135  K 4.0 3.9 4.1  CL 96 101 103  CO2 22 25 25   GLUCOSE 126* 93 105*  BUN 28* 24* 19  CREATININE 1.06 1.09 0.95  CALCIUM 8.2* 8.1* 8.3*   Liver Function Tests:  Recent Labs Lab 07/24/12 1810  AST 17  ALT 12  ALKPHOS 122*  BILITOT 0.4  PROT 6.8  ALBUMIN 2.7*   No results found for this basename: LIPASE, AMYLASE,  in the last 168 hours No results found for this basename: AMMONIA,  in the last 168 hours CBC:  Recent Labs Lab 07/24/12 1810 07/25/12 0350 07/26/12 0422  WBC 22.6* 22.0* 14.1*  NEUTROABS 20.4*  --   --   HGB 11.7* 10.6* 10.8*  HCT 34.3* 31.2* 31.6*  MCV 93.5 94.0 93.2  PLT 258 251 248   Cardiac Enzymes: No results found for this basename: CKTOTAL, CKMB, CKMBINDEX, TROPONINI,  in the last 168 hours BNP (last 3 results) No results found for this basename: PROBNP,  in the last 8760 hours CBG:  Recent Labs Lab 07/25/12 1155 07/25/12 1620 07/25/12 2251 07/26/12 0747 07/26/12 1245  GLUCAP 189* 112*  97 119* 142*    Recent Results (from the past 240 hour(s))  CULTURE, BLOOD (ROUTINE X 2)     Status: None   Collection Time    07/24/12  6:10 PM      Result Value Range Status   Specimen Description BLOOD RIGHT ARM   Final   Special Requests BOTTLES DRAWN AEROBIC AND ANAEROBIC   Final   Culture  Setup Time 07/25/2012 02:41   Final   Culture     Final   Value:        BLOOD CULTURE RECEIVED NO GROWTH TO DATE CULTURE WILL BE HELD FOR 5 DAYS BEFORE ISSUING A FINAL NEGATIVE REPORT   Report Status PENDING   Incomplete  CULTURE, BLOOD (ROUTINE X 2)     Status: None   Collection Time    07/24/12  6:15 PM      Result Value Range Status    Specimen Description BLOOD RIGHT ARM   Final   Special Requests BOTTLES DRAWN AEROBIC AND ANAEROBIC   Final   Culture  Setup Time 07/25/2012 02:39   Final   Culture     Final   Value:        BLOOD CULTURE RECEIVED NO GROWTH TO DATE CULTURE WILL BE HELD FOR 5 DAYS BEFORE ISSUING A FINAL NEGATIVE REPORT   Report Status PENDING   Incomplete  URINE CULTURE     Status: None   Collection Time    07/24/12  9:04 PM      Result Value Range Status   Specimen Description URINE, RANDOM   Final   Special Requests NONE   Final   Culture  Setup Time 07/25/2012 08:45   Final   Colony Count NO GROWTH   Final   Culture NO GROWTH   Final   Report Status 07/26/2012 FINAL   Final     Studies: Dg Chest 2 View  07/24/2012  *RADIOLOGY REPORT*  Clinical Data: Fever, cough, weakness, history diabetes, hypertension  CHEST - 2 VIEW  Comparison: 06/01/2011  Findings: Upper normal heart size. Calcified tortuous aorta. Normal mediastinal contours and pulmonary vascularity otherwise seen. Minimal basilar atelectasis. Lungs otherwise clear. No pleural effusion or pneumothorax. Bones demineralized.  IMPRESSION: Minimal basilar atelectasis.   Original Report Authenticated By: Ulyses Southward, M.D.    Ct Angio Chest W/cm &/or Wo Cm  07/24/2012  *RADIOLOGY REPORT*  Clinical Data: Short of breath, cough  CT ANGIOGRAPHY CHEST  Technique:  Multidetector CT imaging of the chest using the standard protocol during bolus administration of intravenous contrast. Multiplanar reconstructed images including MIPs were obtained and reviewed to evaluate the vascular anatomy.  Contrast: OMNIPAQUE IOHEXOL 350 MG/ML SOLN  Comparison: Chest x-ray earlier today 07/24/2012 at 17 44 p.m.; CT abdomen/pelvis 06/06/2010  Findings:  Mediastinum: Unremarkable CT appearance of the thyroid gland.  No suspicious mediastinal adenopathy.  Prominent right hilar lymphoid tissue measures up to 1.7 cm.  No soft tissue mediastinal mass. The thoracic esophagus  is unremarkable.  Heart/Vascular: Excellent opacification of the pulmonary arteries to the proximal subsegmental level. No central filling defect to suggest acute pulmonary embolus.  The main pulmonary arteries within normal limits for size.  Borderline cardiomegaly. Atherosclerotic vascular calcifications noted in the left main, left anterior descending, circumflex and likely the right coronary arteries.  There is no pericardial effusion.  An aberrant right subclavian artery noted incidentally.  The thoracic aorta is ectatic and tortuous with scattered atherosclerotic vascular calcifications but no aneurysmal dilatation  or dissection.  Lungs/Pleura:  Patchy foci sig of ground-glass attenuation opacity and a bronchovascular distribution in the left upper and right middle lobes consistent with an underlying infectious/inflammatory process.  Dependent atelectasis is noted in the bilateral lower lobes.  There is diffuse bronchial wall thickening.  An elongated area of rounded consolidation medially in the left lower lobe measures 1.8 x 1.7 by 3.4 cm in diameter.  No pleural effusion or pneumothorax.  Additional scattered small pulmonary nodules are nonspecific but likely infectious/inflammatory/granulomatous.  A calcified granuloma is noted in the right lower lobe.  Upper Abdomen: Exophytic water attenuation lesions exophytic from the upper poles of the kidneys measure 2.5 cm on the left than 1.7 cm on the right.  Otherwise, limited imaging of the upper abdomen is unremarkable.  Bones: No acute fracture or aggressive appearing lytic or blastic osseous lesion.  IMPRESSION:  1.  Negative pulmonary embolus.  2.  Bronchovascular distribution of patchy ground-glass opacities in the left upper and right middle lobes most consistent with multi focal infectious/inflammatory process including pneumonia.  3.  An elongated, round and mass-like focus of consolidation in the medial left lower lobe is favored to also reflect an  infectious/inflammatory process such as round  pneumonia.  However, given the mass-like appearance on the axial images, an underlying primary bronchogenic carcinoma cannot be excluded.  Recommend short interval follow-up chest CT in 4 - 6 weeks following an appropriate course of therapy.  If the lesion persists, further evaluation with PET CT or biopsy may be warranted.  4.  Prominent right hilar lymphoid tissue is presumed to be reactive.  This can also be further evaluated on follow-up chest CT.  5.  Atherosclerosis including left main and three-vessel coronary artery disease  6.  Aberrant right subclavian artery noted incidentally.   Original Report Authenticated By: Malachy Moan, M.D.     Scheduled Meds: . aspirin EC  81 mg Oral QODAY  . diphenhydrAMINE  50 mg Oral QHS  . enoxaparin (LOVENOX) injection  40 mg Subcutaneous Q24H  . feeding supplement  237 mL Oral TID BM  . finasteride  5 mg Oral Q lunch  . insulin aspart  0-9 Units Subcutaneous TID WC  . levofloxacin (LEVAQUIN) IV  750 mg Intravenous Q48H  . metoprolol succinate  25 mg Oral Q lunch  . pantoprazole  40 mg Oral Daily  . potassium chloride  10 mEq Oral QID   Continuous Infusions: . sodium chloride 75 mL/hr at 07/26/12 9604      Time spent: 25 minutes    DHUNGEL, NISHANT  [facility] Pager [phone]. If 7PM-7AM, please contact night-coverage at [email or web address], password [facility] 07/26/2012, 2:33 PM  LOS: 2 days

## 2012-07-26 NOTE — Progress Notes (Signed)
CSW provided patient's spouse with bed offers. They request [facility]. CSW notified [facility] of acceptance of bed offer.  Bethany C. Corcoran MSW, LCSW [phone]

## 2012-07-27 LAB — GLUCOSE, CAPILLARY
Glucose-Capillary: 96 mg/dL (ref 70–99)
Glucose-Capillary: 123 mg/dL — ABNORMAL HIGH (ref 70–99)

## 2012-07-27 LAB — CBC
Hemoglobin: 9.7 g/dL — ABNORMAL LOW (ref 13.0–17.0)
MCH: 31.3 pg (ref 26.0–34.0)
RBC: 3.1 MIL/uL — ABNORMAL LOW (ref 4.22–5.81)

## 2012-07-27 LAB — IRON AND TIBC
Saturation Ratios: 26 % (ref 20–55)
UIBC: 134 ug/dL (ref 125–400)

## 2012-07-27 NOTE — Progress Notes (Addendum)
[facility] PROGRESS NOTE  Tim Black WGN:[id number] DOB: [birth year] DOA: 07/24/2012 PCP: [name], MD  Brief narrative  79 y/o male with hx of HTN, GERD, Parkinson's disease presenting with a large weakness and poor appetite. CT scan of the chest done in the ED showed multifocal pneumonia.   Assessment/Plan:  Multifocal pneumonia  Continue Levaquin. Sputum culture ordered. Urine strep ag positive. Legionella antigen negative  leukocytosis slowly improving.  Possible left lower mass seen on CT scan likely to be infectious. Repeat chest CT needed in the 4-6 weeks to confirm resolution.  -Discontinue IV fluids   Parkinson's Disease  taken off his medications by his neurologist   Hyponatremia  Improved with hydration.   Recent right ankle fracture  Continue with boot. Patient missed his appointment with his orthopedic surgeon as he was in the hospital.. He was supposed to see Dr Luiz Blare. Have scheduled appt for 3/20 at 10 am.   Generalized weakness  Physical therapy eval and nutrition consult.   Anemia No recent baseline hemoglobin in the system. Check stool for occult blood and iron panel.  Code Status: full code  Family Communication: Wife at bedside  Disposition Plan: Skilled nursing facility. Referral sent for [facility]. Likely be discharged on 3/17  Consultants:  NONE Procedures:  none Antibiotics:  levaquin ( 3/12---)   HPI/Subjective: Patient seen and examined this morning. Denies any symptoms.  Objective: Filed Vitals:   07/26/12 0531 07/26/12 1400 07/26/12 2205 07/27/12 0605  BP: 104/58 119/67 130/75 144/64  Pulse: 76 67 57 61  Temp: 98.6 F (37 C) 98.9 F (37.2 C) 97.7 F (36.5 C) 98.2 F (36.8 C)  TempSrc: Oral Oral Oral Oral  Resp: 20 18 16    Height:      Weight:      SpO2: 97% 98% 96% 96%    Intake/Output Summary (Last 24 hours) at 07/27/12 1213 Last data filed at 07/27/12 1000  Gross per 24 hour  Intake    720 ml   Output   1427 ml  Net   -707 ml   Filed Weights   07/25/12 0020  Weight: 61.236 kg (135 lb)    Exam:  General: Elderly male in no acute distress  HEENT: No pallor, moist oral mucosa  Cardiovascular: S1 and S2, no murmurs rub or gallop  Respiratory: Minimal basal crackles. no rhonchi or wheeze  Abdomen: , Nontender, nondistended, bowel sounds present  Musculoskeletal: , No edema , right ankle boot  CNS: AAO x2, nonfocal   Data Reviewed: Basic Metabolic Panel:  Recent Labs Lab 07/24/12 1810 07/25/12 0350 07/26/12 0422  NA 129* 133* 135  K 4.0 3.9 4.1  CL 96 101 103  CO2 22 25 25   GLUCOSE 126* 93 105*  BUN 28* 24* 19  CREATININE 1.06 1.09 0.95  CALCIUM 8.2* 8.1* 8.3*   Liver Function Tests:  Recent Labs Lab 07/24/12 1810  AST 17  ALT 12  ALKPHOS 122*  BILITOT 0.4  PROT 6.8  ALBUMIN 2.7*   No results found for this basename: LIPASE, AMYLASE,  in the last 168 hours No results found for this basename: AMMONIA,  in the last 168 hours CBC:  Recent Labs Lab 07/24/12 1810 07/25/12 0350 07/26/12 0422 07/27/12 0450  WBC 22.6* 22.0* 14.1* 8.9  NEUTROABS 20.4*  --   --   --   HGB 11.7* 10.6* 10.8* 9.7*  HCT 34.3* 31.2* 31.6* 29.0*  MCV 93.5 94.0 93.2 93.5  PLT 258 251 248 232  Cardiac Enzymes: No results found for this basename: CKTOTAL, CKMB, CKMBINDEX, TROPONINI,  in the last 168 hours BNP (last 3 results) No results found for this basename: PROBNP,  in the last 8760 hours CBG:  Recent Labs Lab 07/26/12 0747 07/26/12 1245 07/26/12 1732 07/26/12 2201 07/27/12 0750  GLUCAP 119* 142* 102* 97 96    Recent Results (from the past 240 hour(s))  CULTURE, BLOOD (ROUTINE X 2)     Status: None   Collection Time    07/24/12  6:10 PM      Result Value Range Status   Specimen Description BLOOD RIGHT ARM   Final   Special Requests BOTTLES DRAWN AEROBIC AND ANAEROBIC   Final   Culture  Setup Time 07/25/2012 02:41   Final   Culture     Final    Value:        BLOOD CULTURE RECEIVED NO GROWTH TO DATE CULTURE WILL BE HELD FOR 5 DAYS BEFORE ISSUING A FINAL NEGATIVE REPORT   Report Status PENDING   Incomplete  CULTURE, BLOOD (ROUTINE X 2)     Status: None   Collection Time    07/24/12  6:15 PM      Result Value Range Status   Specimen Description BLOOD RIGHT ARM   Final   Special Requests BOTTLES DRAWN AEROBIC AND ANAEROBIC   Final   Culture  Setup Time 07/25/2012 02:39   Final   Culture     Final   Value:        BLOOD CULTURE RECEIVED NO GROWTH TO DATE CULTURE WILL BE HELD FOR 5 DAYS BEFORE ISSUING A FINAL NEGATIVE REPORT   Report Status PENDING   Incomplete  URINE CULTURE     Status: None   Collection Time    07/24/12  9:04 PM      Result Value Range Status   Specimen Description URINE, RANDOM   Final   Special Requests NONE   Final   Culture  Setup Time 07/25/2012 08:45   Final   Colony Count NO GROWTH   Final   Culture NO GROWTH   Final   Report Status 07/26/2012 FINAL   Final     Studies: No results found.  Scheduled Meds: . aspirin EC  81 mg Oral QODAY  . diphenhydrAMINE  50 mg Oral QHS  . feeding supplement  237 mL Oral TID BM  . finasteride  5 mg Oral Q lunch  . insulin aspart  0-9 Units Subcutaneous TID WC  . levofloxacin (LEVAQUIN) IV  750 mg Intravenous Q48H  . metoprolol succinate  25 mg Oral Q lunch  . pantoprazole  40 mg Oral Daily  . potassium chloride  10 mEq Oral QID   Continuous Infusions:     Time spent: 25 minutes    Tim Black  [facility] Pager [phone] If 7PM-7AM, please contact night-coverage at [email or web address], password [facility] 07/27/2012, 12:13 PM  LOS: 3 days

## 2012-07-28 LAB — GLUCOSE, CAPILLARY
Glucose-Capillary: 178 mg/dL — ABNORMAL HIGH (ref 70–99)
Glucose-Capillary: 107 mg/dL — ABNORMAL HIGH (ref 70–99)
Glucose-Capillary: 101 mg/dL — ABNORMAL HIGH (ref 70–99)

## 2012-07-28 NOTE — Progress Notes (Signed)
[facility] PROGRESS NOTE  Tim Black ZOX:[id number] DOB: 10/22/1923 DOA: 07/24/2012 PCP: [name], MD Brief narrative  79 y/o male with hx of HTN, GERD, Parkinson's disease presenting with a large weakness and poor appetite. CT scan of the chest done in the ED showed multifocal pneumonia.    Assessment/Plan:  Multifocal pneumonia  Continue Levaquin. Sputum culture ordered. Urine strep ag positive. Legionella antigen negative  leukocytosis slowly improving.  Possible left lower mass seen on CT scan likely to be infectious. Repeat chest CT needed in the 4-6 weeks to confirm resolution.   Parkinson's Disease  taken off his medications by his neurologist .  Hyponatremia  Improved with hydration.   Recent right ankle fracture  Continue with boot. Patient missed his appointment with his orthopedic surgeon as he was in the hospital.. He was supposed to see Dr Luiz Blare. Have scheduled appt for 3/20 at 10 am.   Generalized weakness  Physical therapy eval and nutrition consult.   Anemia  No recent baseline hemoglobin in the system. Check stool for occult blood . Iron panel , TSH. and B12 are normal.  Code Status: full code  Family Communication: Wife at bedside  Disposition Plan: Skilled nursing facility. Referral [facility] for [facility]. Likely be discharged on 3/17  Consultants:  NONE Procedures:  none Antibiotics:  levaquin ( 3/12---), completes 5 day treatment today   HPI/Subjective: No overnight issues.   Objective: Filed Vitals:   07/27/12 1334 07/27/12 2041 07/28/12 0603 07/28/12 1211  BP: 112/61 129/75 128/74 128/73  Pulse: 74 71 78 88  Temp: 97.8 F (36.6 C) 97.9 F (36.6 C) 97.9 F (36.6 C)   TempSrc: Oral Oral Oral   Resp: 16 16 16    Height:      Weight:      SpO2: 98% 94% 97%     Intake/Output Summary (Last 24 hours) at 07/28/12 1420 Last data filed at 07/28/12 0840  Gross per 24 hour  Intake    510 ml  Output   1400 ml  Net   -890 ml    Filed Weights   07/25/12 0020  Weight: 61.236 kg (135 lb)    Exam: General: Elderly male in no acute distress  HEENT: No pallor, moist oral mucosa  Cardiovascular: S1 and S2, no murmurs rub or gallop  Respiratory: Minimal basal crackles. no rhonchi or wheeze  Abdomen: , Nontender, nondistended, bowel sounds present  Musculoskeletal: , No edema , right ankle boot  CNS: AAO x2, nonfocal   Data Reviewed: Basic Metabolic Panel:  Recent Labs Lab 07/24/12 1810 07/25/12 0350 07/26/12 0422  NA 129* 133* 135  K 4.0 3.9 4.1  CL 96 101 103  CO2 22 25 25   GLUCOSE 126* 93 105*  BUN 28* 24* 19  CREATININE 1.06 1.09 0.95  CALCIUM 8.2* 8.1* 8.3*   Liver Function Tests:  Recent Labs Lab 07/24/12 1810  AST 17  ALT 12  ALKPHOS 122*  BILITOT 0.4  PROT 6.8  ALBUMIN 2.7*   No results found for this basename: LIPASE, AMYLASE,  in the last 168 hours No results found for this basename: AMMONIA,  in the last 168 hours CBC:  Recent Labs Lab 07/24/12 1810 07/25/12 0350 07/26/12 0422 07/27/12 0450  WBC 22.6* 22.0* 14.1* 8.9  NEUTROABS 20.4*  --   --   --   HGB 11.7* 10.6* 10.8* 9.7*  HCT 34.3* 31.2* 31.6* 29.0*  MCV 93.5 94.0 93.2 93.5  PLT 258 251 248 232  Cardiac Enzymes: No results found for this basename: CKTOTAL, CKMB, CKMBINDEX, TROPONINI,  in the last 168 hours BNP (last 3 results) No results found for this basename: PROBNP,  in the last 8760 hours CBG:  Recent Labs Lab 07/27/12 1217 07/27/12 1644 07/27/12 2143 07/28/12 0725 07/28/12 1158  GLUCAP 123* 126* 79 106* 178*    Recent Results (from the past 240 hour(s))  CULTURE, BLOOD (ROUTINE X 2)     Status: None   Collection Time    07/24/12  6:10 PM      Result Value Range Status   Specimen Description BLOOD RIGHT ARM   Final   Special Requests BOTTLES DRAWN AEROBIC AND ANAEROBIC   Final   Culture  Setup Time 07/25/2012 02:41   Final   Culture     Final   Value:        BLOOD CULTURE RECEIVED NO  GROWTH TO DATE CULTURE WILL BE HELD FOR 5 DAYS BEFORE ISSUING A FINAL NEGATIVE REPORT   Report Status PENDING   Incomplete  CULTURE, BLOOD (ROUTINE X 2)     Status: None   Collection Time    07/24/12  6:15 PM      Result Value Range Status   Specimen Description BLOOD RIGHT ARM   Final   Special Requests BOTTLES DRAWN AEROBIC AND ANAEROBIC   Final   Culture  Setup Time 07/25/2012 02:39   Final   Culture     Final   Value:        BLOOD CULTURE RECEIVED NO GROWTH TO DATE CULTURE WILL BE HELD FOR 5 DAYS BEFORE ISSUING A FINAL NEGATIVE REPORT   Report Status PENDING   Incomplete  URINE CULTURE     Status: None   Collection Time    07/24/12  9:04 PM      Result Value Range Status   Specimen Description URINE, RANDOM   Final   Special Requests NONE   Final   Culture  Setup Time 07/25/2012 08:45   Final   Colony Count NO GROWTH   Final   Culture NO GROWTH   Final   Report Status 07/26/2012 FINAL   Final     Studies: No results found.  Scheduled Meds: . aspirin EC  81 mg Oral QODAY  . diphenhydrAMINE  50 mg Oral QHS  . feeding supplement  237 mL Oral TID BM  . finasteride  5 mg Oral Q lunch  . insulin aspart  0-9 Units Subcutaneous TID WC  . levofloxacin (LEVAQUIN) IV  750 mg Intravenous Q48H  . metoprolol succinate  25 mg Oral Q lunch  . pantoprazole  40 mg Oral Daily  . potassium chloride  10 mEq Oral QID   Continuous Infusions:      Time spent: 25 minutes    [name]  [facility] Pager [phone]*. If 7PM-7AM, please contact night-coverage at [email or web address], password [facility] 07/28/2012, 2:20 PM  LOS: 4 days

## 2012-07-29 LAB — GLUCOSE, CAPILLARY
Glucose-Capillary: 126 mg/dL — ABNORMAL HIGH (ref 70–99)
Glucose-Capillary: 130 mg/dL — ABNORMAL HIGH (ref 70–99)

## 2012-07-29 MED ORDER — LEVOFLOXACIN 750 MG PO TABS: 750.0000 mg | ORAL_TABLET | Freq: Every day | ORAL | Status: DC

## 2012-07-29 MED ORDER — GLUCERNA SHAKE PO LIQD: 237.0000 mL | Freq: Three times a day (TID) | ORAL | Status: DC

## 2012-07-29 NOTE — Progress Notes (Signed)
Clinical Social Work Department CLINICAL SOCIAL WORK PLACEMENT NOTE 07/29/2012  Patient:  Tim Black, Tim Black  Account Number:  [id number] Admit date:  07/24/2012  Clinical Social Worker:  Becky Sax, LCSW  Date/time:  07/25/2012 12:00 M  Clinical Social Work is seeking post-discharge placement for this patient at the following level of care:   SKILLED NURSING   (*CSW will update this form in Epic as items are completed)   07/25/2012  Patient/family provided with [facility] Department of Clinical Social Work's list of facilities offering this level of care within the geographic area requested by the patient (or if unable, by the patient's family).  07/25/2012  Patient/family informed of their freedom to choose among providers that offer the needed level of care, that participate in Medicare, Medicaid or managed care program needed by the patient, have an available bed and are willing to accept the patient.  07/25/2012  Patient/family informed of [facility]' ownership interest in [facility], as well as of the fact that they are under no obligation to receive care at this facility.  PASARR submitted to [employer] on 07/25/2012 PASARR number received from [employer] on 07/25/2012  FL2 transmitted to all facilities in geographic area requested by pt/family on  07/25/2012 FL2 transmitted to all facilities within larger geographic area on 07/25/2012  Patient informed that his/her managed care company has contracts with or will negotiate with  certain facilities, including the following:     Patient/family informed of bed offers received:  07/26/2012 Patient chooses bed at [facility] Physician recommends and patient chooses bed at    Patient to be transferred to [facility] on  07/29/2012 Patient to be transferred to facility by ambulance Sharin Mons)  The following physician request were entered in Epic:   Additional Comments:  Jacklynn Lewis, MSW,  LCSWA (coverage for Cori Razor, LCSW) Clinical Social Work [phone]

## 2012-07-29 NOTE — Progress Notes (Signed)
Pt for discharge to [facility] and [facility] today.  CSW spoke with pt insurance company, [employer] and currently awaiting insurance authorization.   CSW discussed with pt at bedside and pt niece, Elnita Maxwell via telephone with pt permission.  CSW to facilitate pt discharge needs this afternoon.   Jacklynn Lewis, MSW, LCSWA  Clinical Social Work [phone]

## 2012-07-29 NOTE — Progress Notes (Addendum)
Pt for discharge to [facility] and [facility].  CSW faxed pt discharge information via [facility], discussed with pt at bedside and pt niece, Elnita Maxwell via telephone, provided RN phone number to call report, received insurance authorization through [facility], and scheduled ambulance transportation at 3 pm at facility request.   No further social work needs identified at this time.   CSW signing off.   Jacklynn Lewis, MSW, LCSWA (coverage for Cori Razor, [place]) Clinical Social Work [phone]

## 2012-07-29 NOTE — Discharge Summary (Addendum)
Physician Discharge Summary  Tim Black YNW:[id number] DOB: [birth year] DOA: 07/24/2012  PCP: Tim Floro, MD  Admit date: 07/24/2012 Discharge date: 07/29/2012  Time spent:40 minutes  Recommendations for Outpatient Follow-up:  1. D/c to SNF 2.  needs follow up with PCP after discharged from SNF. questionable left lower lobe mass seen on CT which needs to be followed with repeat CT in 6 weeks as outpatient.   Discharge Diagnoses:  Principal Problem:   Community acquired pneumonia   Active Problems:   Hyponatremia   Anemia   Generalized weakness   Fall at home   Diabetes mellitus type 2 in nonobese    Right Ankle fracture   Parkinson's disease   Discharge Condition: fair  Diet recommendation: diabetic  Filed Weights   07/25/12 0020  Weight: 61.236 kg (135 lb)    History of present illness:  Please refer to admission H&P for details, but in brief, 79 y/o male with hx of HTN, GERD, Parkinson's disease, chr anemia presenting with a large weakness and poor appetite. Labs Noted for leucocytosis and hyponatremia. CT scan of the chest done in the ED showed multifocal pneumonia.    Hospital Course:    Multifocal pneumonia  Placed on IV levaquin. Urine strep ag positive. Legionella antigen negative  Leukocytosis resolved Possible left lower mass seen on CT scan likely to be infectious. Repeat chest CT needed in the 4-6 weeks to confirm resolution.  -patient clinically stable and  will be discharged on levaquin to complete a total 7 day course.  Parkinson's Disease  taken off his medications by his neurologist .   Hyponatremia  Improved with hydration.   Recent right ankle fracture  Continue with boot. Patient missed his appointment with his orthopedic surgeon as he was in the hospital.. He was supposed to see Tim Black. Have scheduled appt for 3/20 at 10 am.   Generalized weakness  Seen by PT and nutrition consult. recommend SNF  Anemia  No recent  baseline hemoglobin in the system. Check stool for occult blood . Iron panel , TSH. and B12 are normal.   Code Status: full code  Family Communication: Discussed with wife  Patient stable  for discharge to SNF  Consultants:  NONE Procedures:  none Antibiotics:  levaquin     Discharge Exam: Filed Vitals:   07/28/12 1211 07/28/12 1300 07/28/12 2100 07/29/12 0540  BP: 128/73 128/58 135/76 143/68  Pulse: 88 78 68 69  Temp:  97.8 F (36.6 C) 97.6 F (36.4 C) 98.1 F (36.7 C)  TempSrc:  Oral Oral Oral  Resp:  16 16 20   Height:      Weight:      SpO2:  98% 96% 94%    General: Elderly male in no acute distress  HEENT: No pallor, moist oral mucosa  Cardiovascular: S1 and S2, no murmurs rub or gallop  Respiratory: Minimal basal crackles. no rhonchi or wheeze  Abdomen: , Nontender, nondistended, bowel sounds present  Musculoskeletal: , No edema , right ankle boot  CNS: AAO x2, nonfocal   Discharge Instructions     Medication List    TAKE these medications       aspirin 81 MG EC tablet  Take 81 mg by mouth every other day. Swallow whole.     diphenhydrAMINE 25 MG tablet  Commonly known as:  BENADRYL  Take 50 mg by mouth at bedtime.     feeding supplement Liqd  Take 237 mLs by mouth 3 (three) times daily between meals.  finasteride 5 MG tablet  Commonly known as:  PROSCAR  Take 5 mg by mouth daily with lunch.     fish oil-omega-3 fatty acids 1000 MG capsule  Take 4 g by mouth daily with lunch.     levofloxacin 750 MG tablet  Commonly known as:  LEVAQUIN  Take 1 tablet (750 mg total) by mouth daily.     lisinopril-hydrochlorothiazide 10-12.5 MG per tablet  Commonly known as:  PRINZIDE,ZESTORETIC  Take 1 tablet by mouth daily with lunch.     metoprolol succinate 50 MG 24 hr tablet  Commonly known as:  TOPROL-XL  Take 25 mg by mouth daily with lunch. Take with or immediately following a meal.     multivitamin with minerals Tabs  Take 1 tablet by mouth  daily with lunch.     omeprazole 20 MG capsule  Commonly known as:  PRILOSEC  Take 20 mg by mouth daily with lunch.     potassium chloride 10 MEQ tablet  Commonly known as:  K-DUR,KLOR-CON  Take 10 mEq by mouth 4 (four) times daily.           Follow-up Information   Follow up with Tim Floro, MD In 1 week. (after discharge from SNF. needs outaptient CT chest in 6 weeks to evalaute resolution of pnenumonia and)    Contact information:   [address] [place] [address] [phone]     follow up scheduled with orthopedics Tim  Tim Black on 3/20 at 10 am ( [address] , [place], [place])   The results of significant diagnostics from this hospitalization (including imaging, microbiology, ancillary and laboratory) are listed below for reference.    Significant Diagnostic Studies: Dg Chest 2 View  07/24/2012  *RADIOLOGY REPORT*  Clinical Data: Fever, cough, weakness, history diabetes, hypertension  CHEST - 2 VIEW  Comparison: 06/01/2011  Findings: Upper normal heart size. Calcified tortuous aorta. Normal mediastinal contours and pulmonary vascularity otherwise seen. Minimal basilar atelectasis. Lungs otherwise clear. No pleural effusion or pneumothorax. Bones demineralized.  IMPRESSION: Minimal basilar atelectasis.   Original Report Authenticated By: Ulyses Southward, M.D.    Ct Angio Chest W/cm &/or Wo Cm  07/24/2012  *RADIOLOGY REPORT*  Clinical Data: Short of breath, cough  CT ANGIOGRAPHY CHEST  Technique:  Multidetector CT imaging of the chest using the standard protocol during bolus administration of intravenous contrast. Multiplanar reconstructed images including MIPs were obtained and reviewed to evaluate the vascular anatomy.  Contrast: OMNIPAQUE IOHEXOL 350 MG/ML SOLN  Comparison: Chest x-ray earlier today 07/24/2012 at 17 44 p.m.; CT abdomen/pelvis 06/06/2010  Findings:  Mediastinum: Unremarkable CT appearance of the thyroid gland.  No suspicious mediastinal  adenopathy.  Prominent right hilar lymphoid tissue measures up to 1.7 cm.  No soft tissue mediastinal mass. The thoracic esophagus is unremarkable.  Heart/Vascular: Excellent opacification of the pulmonary arteries to the proximal subsegmental level. No central filling defect to suggest acute pulmonary embolus.  The main pulmonary arteries within normal limits for size.  Borderline cardiomegaly. Atherosclerotic vascular calcifications noted in the left main, left anterior descending, circumflex and likely the right coronary arteries.  There is no pericardial effusion.  An aberrant right subclavian artery noted incidentally.  The thoracic aorta is ectatic and tortuous with scattered atherosclerotic vascular calcifications but no aneurysmal dilatation or dissection.  Lungs/Pleura:  Patchy foci sig of ground-glass attenuation opacity and a bronchovascular distribution in the left upper and right middle lobes consistent with an underlying infectious/inflammatory process.  Dependent atelectasis  is noted in the bilateral lower lobes.  There is diffuse bronchial wall thickening.  An elongated area of rounded consolidation medially in the left lower lobe measures 1.8 x 1.7 by 3.4 cm in diameter.  No pleural effusion or pneumothorax.  Additional scattered small pulmonary nodules are nonspecific but likely infectious/inflammatory/granulomatous.  A calcified granuloma is noted in the right lower lobe.  Upper Abdomen: Exophytic water attenuation lesions exophytic from the upper poles of the kidneys measure 2.5 cm on the left than 1.7 cm on the right.  Otherwise, limited imaging of the upper abdomen is unremarkable.  Bones: No acute fracture or aggressive appearing lytic or blastic osseous lesion.  IMPRESSION:  1.  Negative pulmonary embolus.  2.  Bronchovascular distribution of patchy ground-glass opacities in the left upper and right middle lobes most consistent with multi focal infectious/inflammatory process including  pneumonia.  3.  An elongated, round and mass-like focus of consolidation in the medial left lower lobe is favored to also reflect an infectious/inflammatory process such as round  pneumonia.  However, given the mass-like appearance on the axial images, an underlying primary bronchogenic carcinoma cannot be excluded.  Recommend short interval follow-up chest CT in 4 - 6 weeks following an appropriate course of therapy.  If the lesion persists, further evaluation with PET CT or biopsy may be warranted.  4.  Prominent right hilar lymphoid tissue is presumed to be reactive.  This can also be further evaluated on follow-up chest CT.  5.  Atherosclerosis including left main and three-vessel coronary artery disease  6.  Aberrant right subclavian artery noted incidentally.   Original Report Authenticated By: Malachy Moan, M.D.     Microbiology: Recent Results (from the past 240 hour(s))  CULTURE, BLOOD (ROUTINE X 2)     Status: None   Collection Time    07/24/12  6:10 PM      Result Value Range Status   Specimen Description BLOOD RIGHT ARM   Final   Special Requests BOTTLES DRAWN AEROBIC AND ANAEROBIC   Final   Culture  Setup Time 07/25/2012 02:41   Final   Culture     Final   Value:        BLOOD CULTURE RECEIVED NO GROWTH TO DATE CULTURE WILL BE HELD FOR 5 DAYS BEFORE ISSUING A FINAL NEGATIVE REPORT   Report Status PENDING   Incomplete  CULTURE, BLOOD (ROUTINE X 2)     Status: None   Collection Time    07/24/12  6:15 PM      Result Value Range Status   Specimen Description BLOOD RIGHT ARM   Final   Special Requests BOTTLES DRAWN AEROBIC AND ANAEROBIC   Final   Culture  Setup Time 07/25/2012 02:39   Final   Culture     Final   Value:        BLOOD CULTURE RECEIVED NO GROWTH TO DATE CULTURE WILL BE HELD FOR 5 DAYS BEFORE ISSUING A FINAL NEGATIVE REPORT   Report Status PENDING   Incomplete  URINE CULTURE     Status: None   Collection Time    07/24/12  9:04 PM      Result Value Range  Status   Specimen Description URINE, RANDOM   Final   Special Requests NONE   Final   Culture  Setup Time 07/25/2012 08:45   Final   Colony Count NO GROWTH   Final   Culture NO GROWTH   Final   Report Status 07/26/2012  FINAL   Final     Labs: Basic Metabolic Panel:  Recent Labs Lab 07/24/12 1810 07/25/12 0350 07/26/12 0422  NA 129* 133* 135  K 4.0 3.9 4.1  CL 96 101 103  CO2 22 25 25   GLUCOSE 126* 93 105*  BUN 28* 24* 19  CREATININE 1.06 1.09 0.95  CALCIUM 8.2* 8.1* 8.3*   Liver Function Tests:  Recent Labs Lab 07/24/12 1810  AST 17  ALT 12  ALKPHOS 122*  BILITOT 0.4  PROT 6.8  ALBUMIN 2.7*   No results found for this basename: LIPASE, AMYLASE,  in the last 168 hours No results found for this basename: AMMONIA,  in the last 168 hours CBC:  Recent Labs Lab 07/24/12 1810 07/25/12 0350 07/26/12 0422 07/27/12 0450  WBC 22.6* 22.0* 14.1* 8.9  NEUTROABS 20.4*  --   --   --   HGB 11.7* 10.6* 10.8* 9.7*  HCT 34.3* 31.2* 31.6* 29.0*  MCV 93.5 94.0 93.2 93.5  PLT 258 251 248 232   Cardiac Enzymes: No results found for this basename: CKTOTAL, CKMB, CKMBINDEX, TROPONINI,  in the last 168 hours BNP: BNP (last 3 results) No results found for this basename: PROBNP,  in the last 8760 hours CBG:  Recent Labs Lab 07/28/12 0725 07/28/12 1158 07/28/12 1706 07/28/12 2106 07/29/12 0727  GLUCAP 106* 178* 107* 101* 126*       Signed:  Willis Holquin  [facility] 07/29/2012, 10:16 AM

## 2012-07-29 NOTE — Care Management Note (Signed)
    Page 1 of 1   07/29/2012     10:48:58 AM   CARE MANAGEMENT NOTE 07/29/2012  Patient:  Tim Black, Tim Black   Account Number:  [id number]  Date Initiated:  07/25/2012  Documentation initiated by:  Lorenda Ishihara  Subjective/Objective Assessment:   79 yo male admitted with PNA. PTA lived at home with spouse and caregivers.     Action/Plan:   Home vs SNF, awaiting PT evals   Anticipated DC Date:  07/29/2012   Anticipated DC Plan:  SKILLED NURSING FACILITY  In-house referral  Clinical Social Worker      DC Planning Services  CM consult      Choice offered to / List presented to:             Status of service:  Completed, signed off Medicare Important Message given?  NA - LOS <3 / Initial given by admissions (If response is "NO", the following Medicare IM given date fields will be blank) Date Medicare IM given:   Date Additional Medicare IM given:    Discharge Disposition:  SKILLED NURSING FACILITY  Per UR Regulation:  Reviewed for med. necessity/level of care/duration of stay  If discussed at Long Length of Stay Meetings, dates discussed:    Comments:

## 2012-07-31 LAB — CULTURE, BLOOD (ROUTINE X 2)
Culture: NO GROWTH
Culture: NO GROWTH

## 2012-09-20 ENCOUNTER — Other Ambulatory Visit: Payer: Self-pay | Admitting: Family Medicine

## 2012-09-20 DIAGNOSIS — R9389 Abnormal findings on diagnostic imaging of other specified body structures: Secondary | ICD-10-CM

## 2013-01-06 ENCOUNTER — Ambulatory Visit: Payer: Self-pay | Admitting: Diagnostic Neuroimaging

## 2013-03-11 IMAGING — CR DG CHEST 2V
2 series · 2 of 2 positions shown · non-contrast
Comparison: PA and lateral chest 06/21/2009.

CLINICAL DATA: Fall.

CHEST - 2 VIEW

[w chest lat]
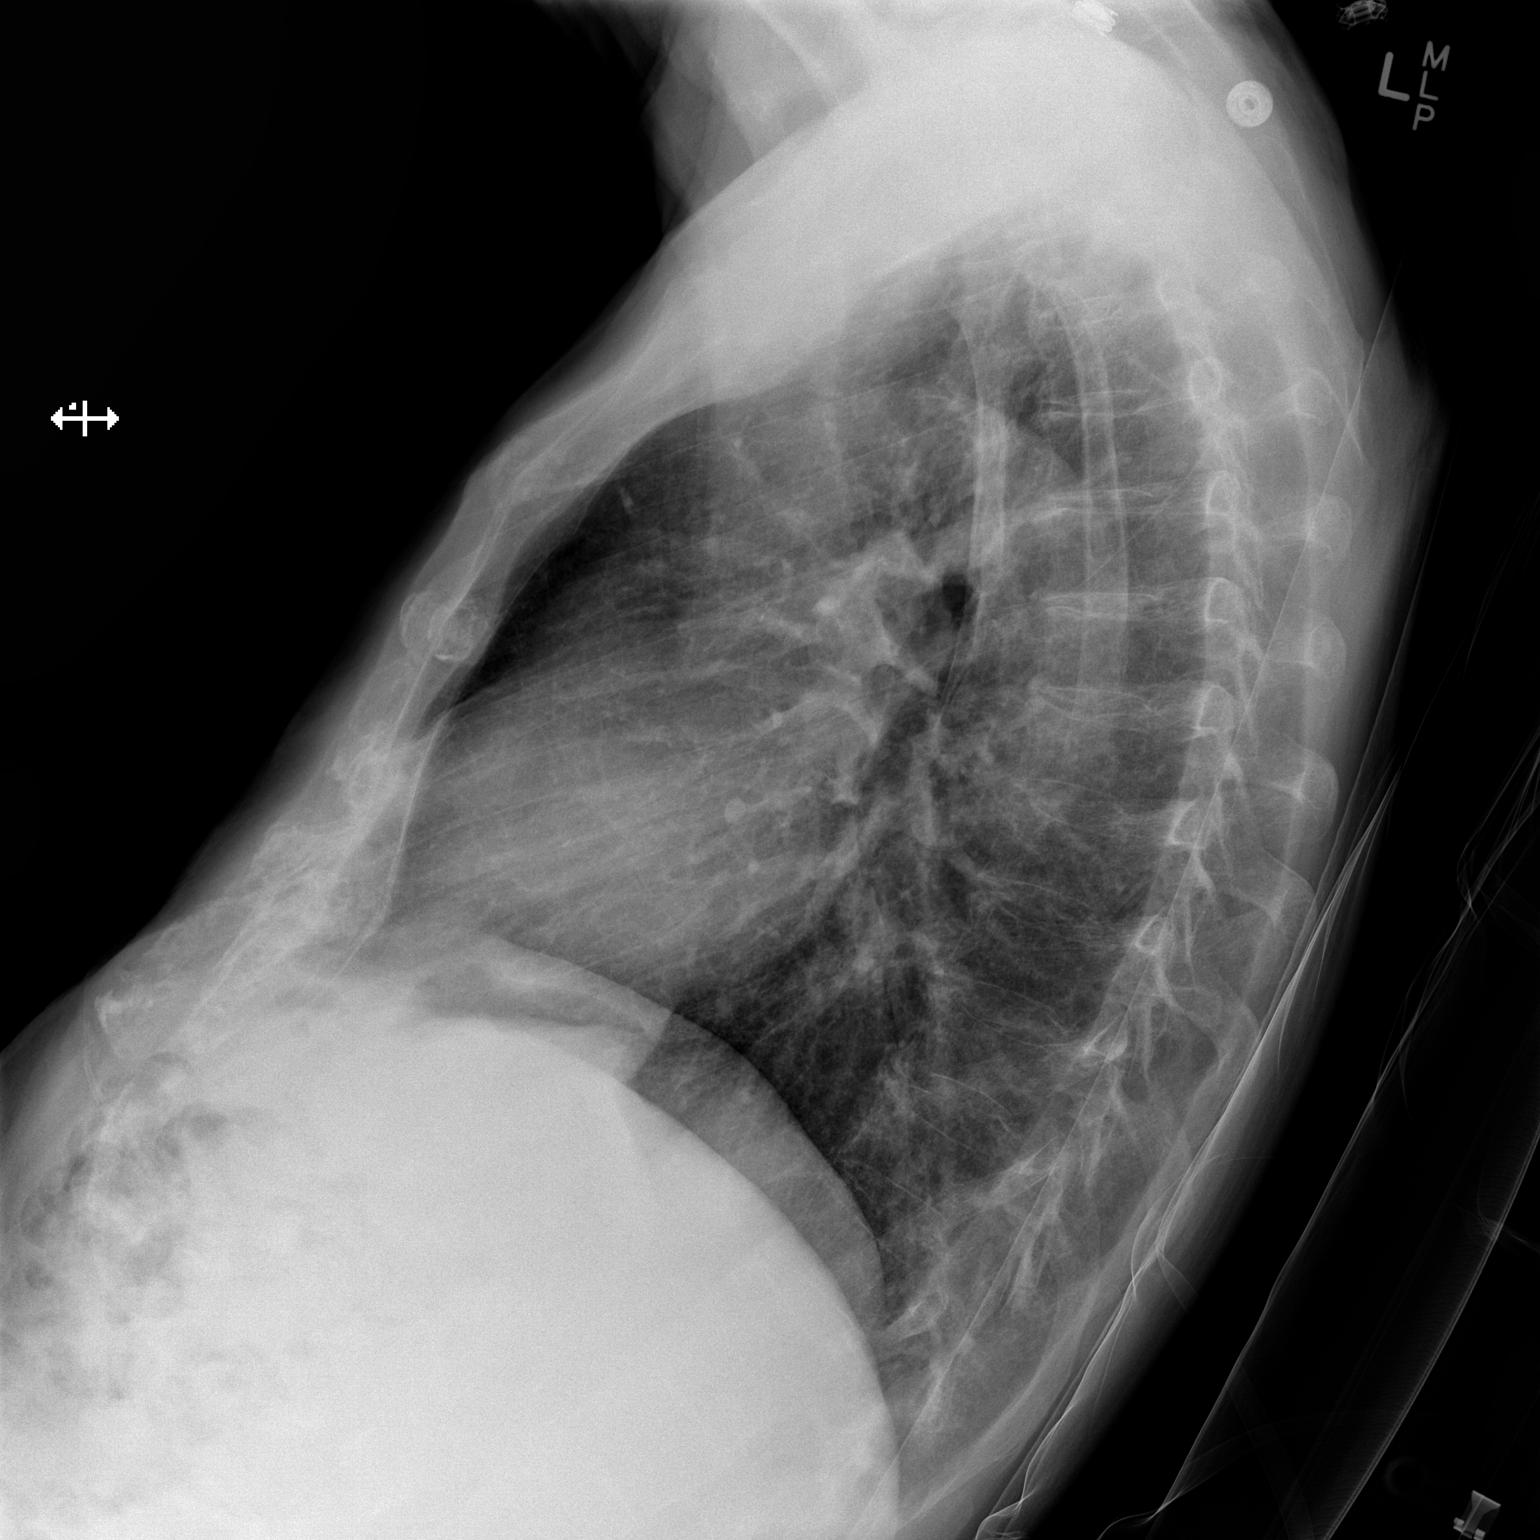

[x chest ap]
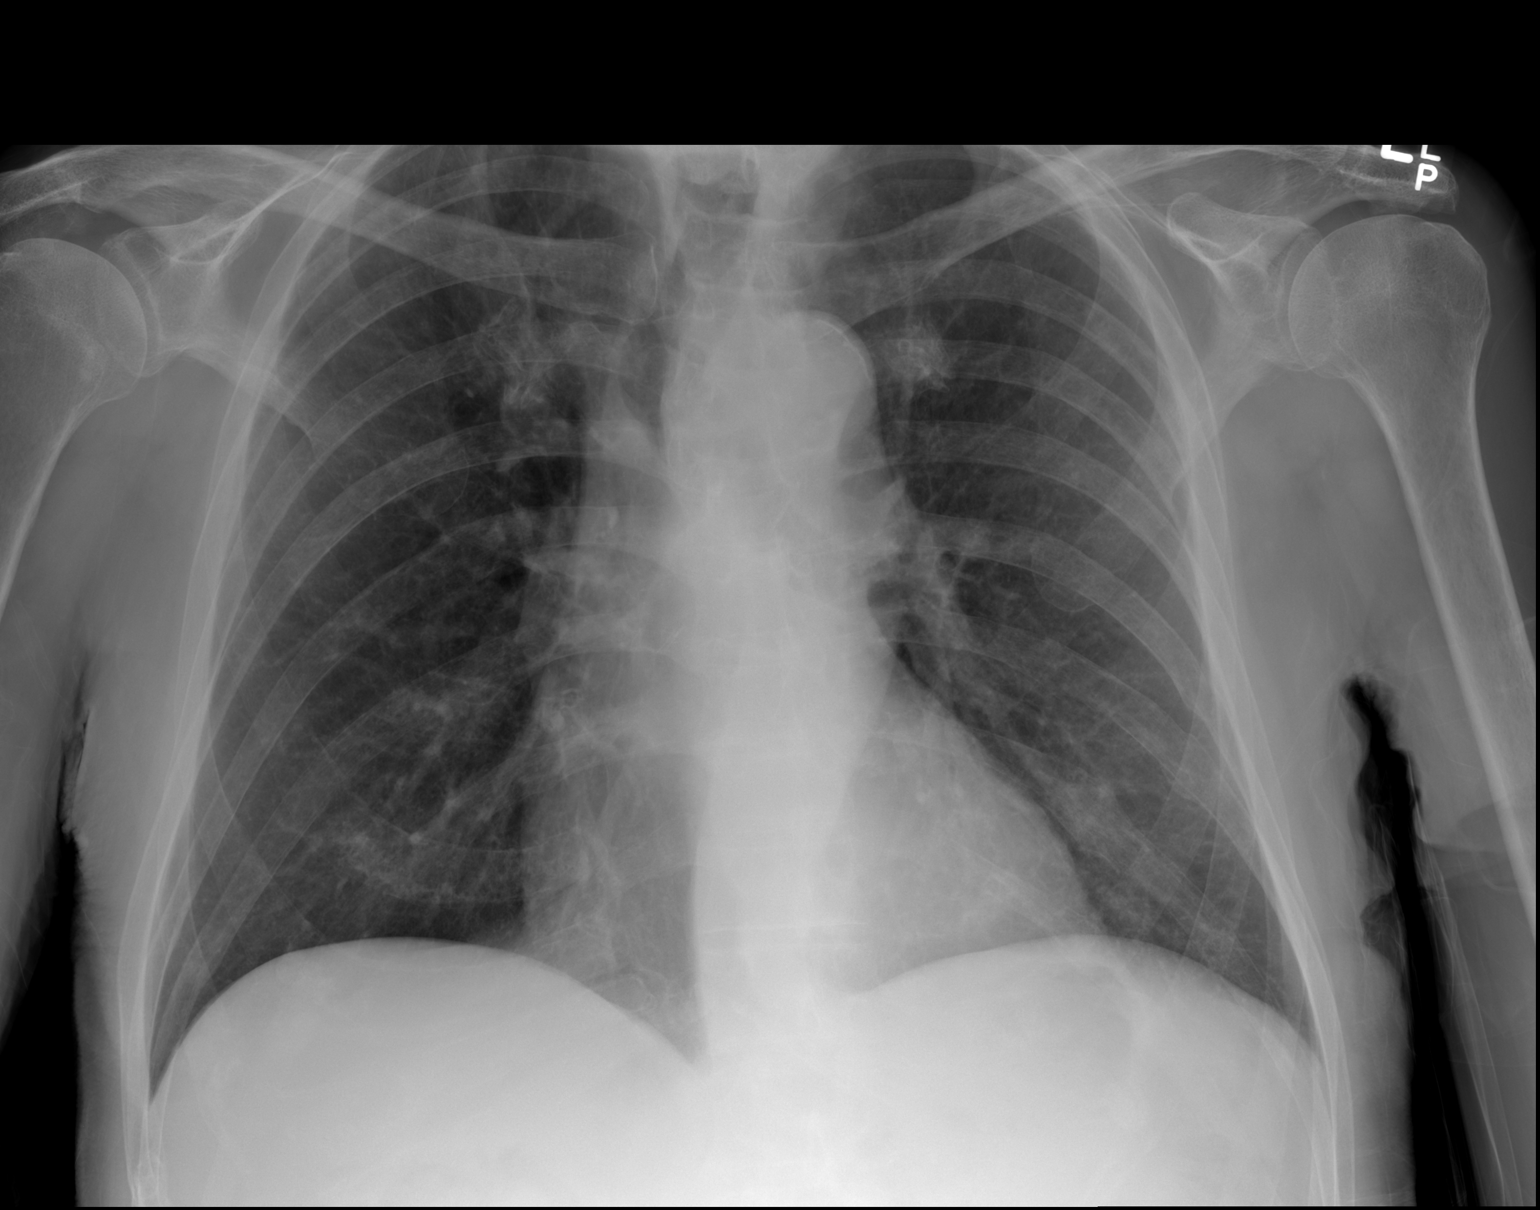

[2 of 2 positions shown; findings below may reference images not displayed]

FINDINGS: Lungs are clear.  Heart size is normal.  No pneumothorax
or pleural effusion.  No focal bony abnormality.
IMPRESSION: No acute disease.

## 2013-03-11 IMAGING — CR DG KNEE COMPLETE 4+V*R*
4 series · 4 of 4 positions shown · non-contrast
Comparison: None.

CLINICAL DATA: Fall, pain.  Laceration.

RIGHT KNEE - COMPLETE 4+ VIEW

[x knee ap right]
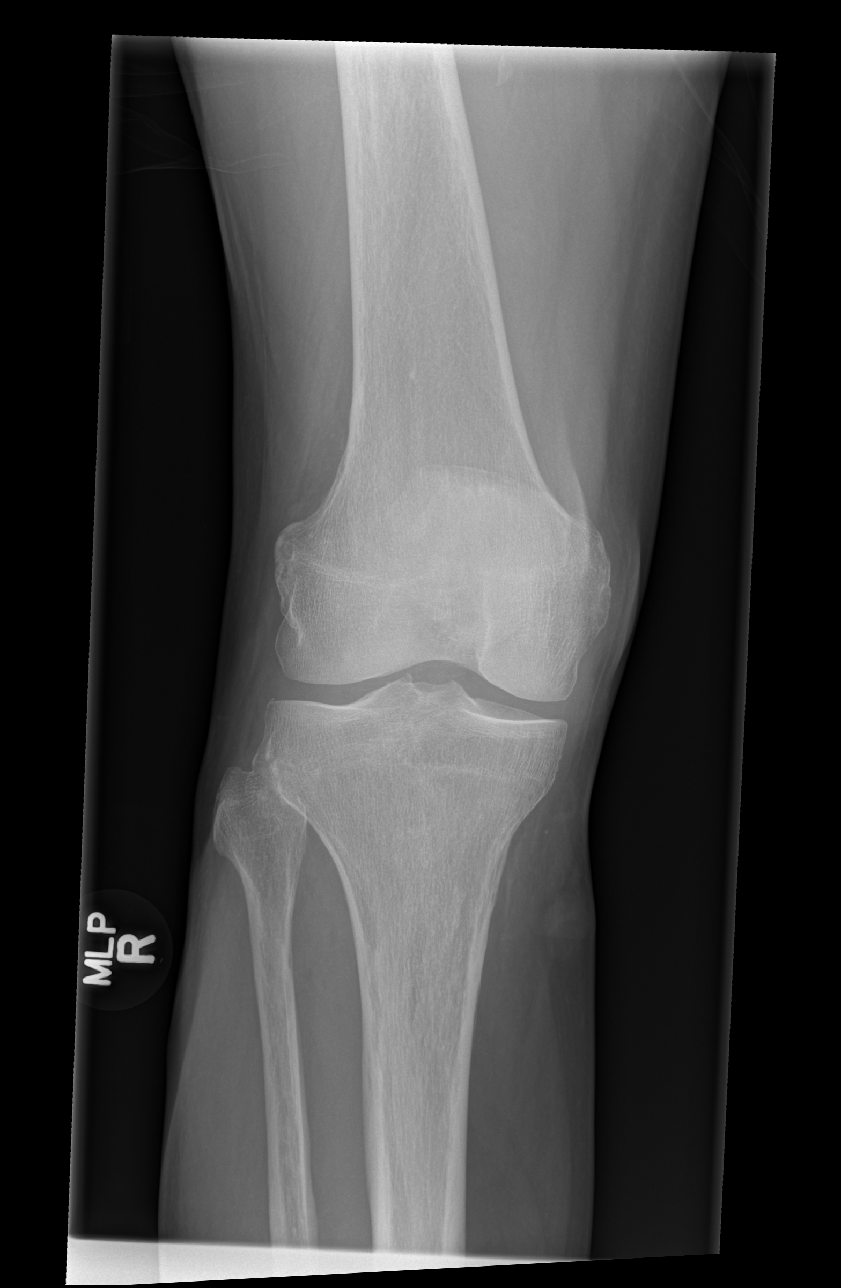

[x knee obl right (1 of 2)]
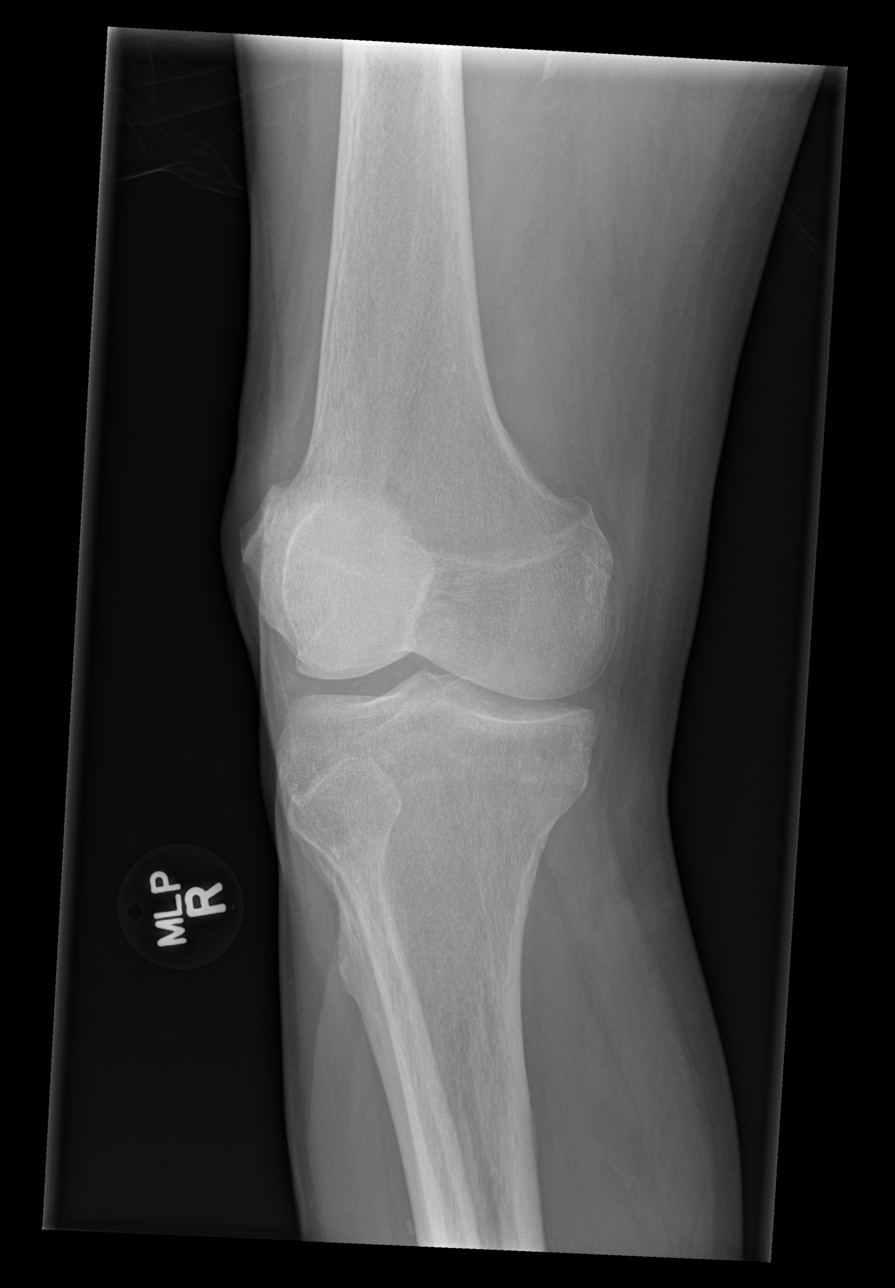

[x knee obl right (2 of 2)]
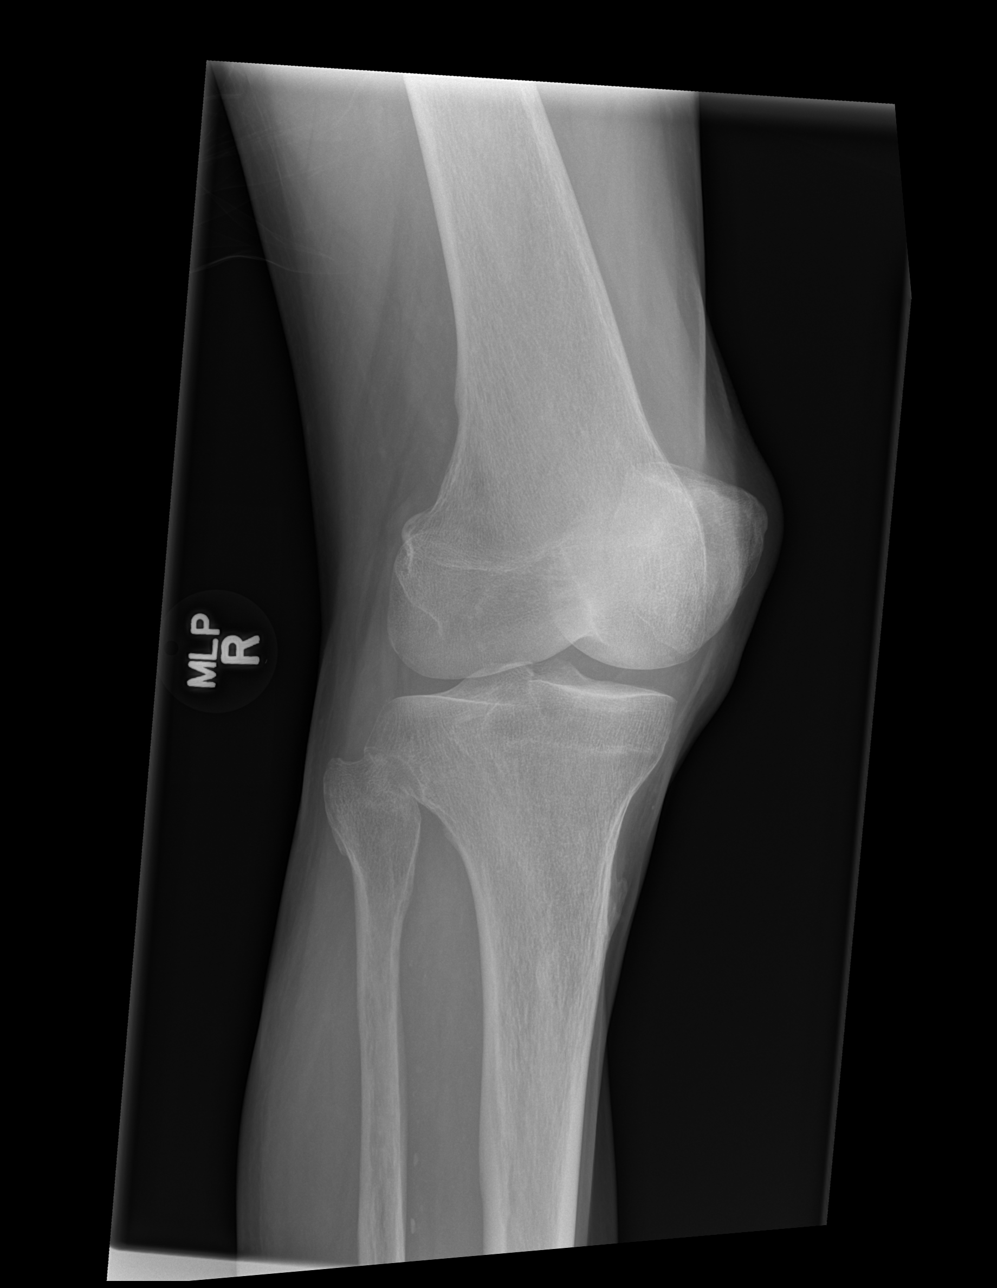

[x knee lat right]
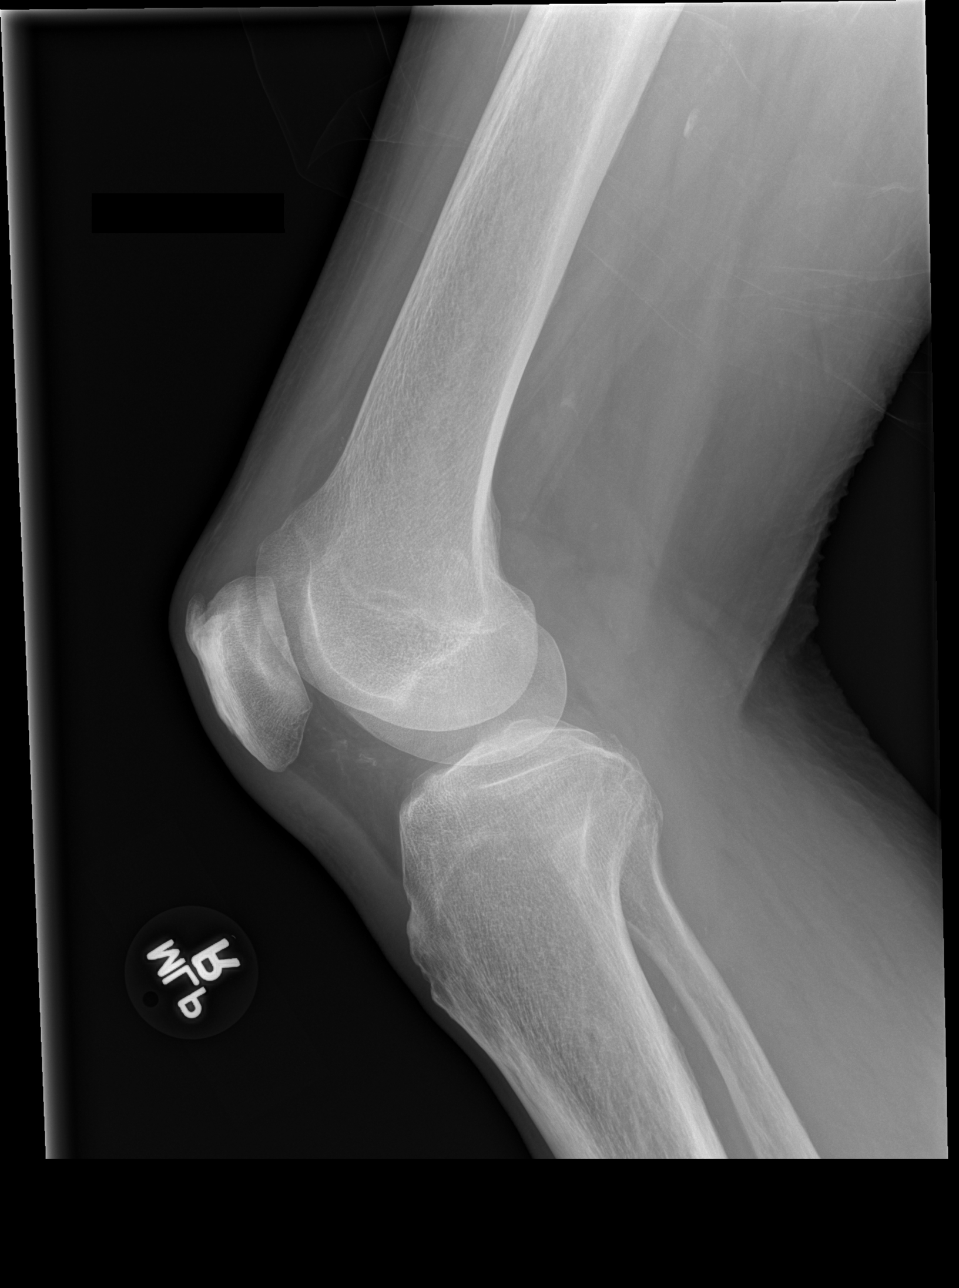

[4 of 4 positions shown; findings below may reference images not displayed]

FINDINGS: No fracture, dislocation or joint effusion is identified.
No radiopaque foreign body.
IMPRESSION: No acute finding.

## 2013-05-19 ENCOUNTER — Ambulatory Visit: Payer: Medicare Other

## 2013-12-29 ENCOUNTER — Other Ambulatory Visit: Payer: Self-pay | Admitting: Family Medicine

## 2013-12-29 ENCOUNTER — Ambulatory Visit
Admission: RE | Admit: 2013-12-29 | Discharge: 2013-12-29 | Disposition: A | Discharge status: Home / Self Care | Payer: Medicare Other | Source: Ambulatory Visit | Attending: Family Medicine | Admitting: Family Medicine

## 2013-12-29 DIAGNOSIS — T17308A Unspecified foreign body in larynx causing other injury, initial encounter: Secondary | ICD-10-CM

## 2013-12-29 DIAGNOSIS — R0989 Other specified symptoms and signs involving the circulatory and respiratory systems: Secondary | ICD-10-CM

## 2013-12-29 DIAGNOSIS — G20 Parkinson's disease: Secondary | ICD-10-CM

## 2014-01-05 ENCOUNTER — Ambulatory Visit
Admission: RE | Admit: 2014-01-05 | Discharge: 2014-01-05 | Disposition: A | Discharge status: Home / Self Care | Payer: Medicare Other | Source: Ambulatory Visit | Attending: Family Medicine | Admitting: Family Medicine

## 2014-01-05 DIAGNOSIS — T17308A Unspecified foreign body in larynx causing other injury, initial encounter: Secondary | ICD-10-CM

## 2014-02-10 ENCOUNTER — Encounter: Payer: Self-pay | Admitting: *Deleted

## 2015-05-16 DEATH — deceased
